# Patient Record
Sex: Male | Born: 1950
Health system: Southern US, Community
[De-identification: ages and names within clinical notes are randomized; demographics above are authoritative.]

## PROBLEM LIST (undated history)

## (undated) DIAGNOSIS — E785 Hyperlipidemia, unspecified: Secondary | ICD-10-CM

## (undated) DIAGNOSIS — E119 Type 2 diabetes mellitus without complications: Secondary | ICD-10-CM

## (undated) DIAGNOSIS — I10 Essential (primary) hypertension: Secondary | ICD-10-CM

## (undated) DIAGNOSIS — L0591 Pilonidal cyst without abscess: Secondary | ICD-10-CM

## (undated) DIAGNOSIS — N529 Male erectile dysfunction, unspecified: Secondary | ICD-10-CM

## (undated) DIAGNOSIS — N400 Enlarged prostate without lower urinary tract symptoms: Secondary | ICD-10-CM

## (undated) DIAGNOSIS — F32A Depression, unspecified: Secondary | ICD-10-CM

## (undated) DIAGNOSIS — F329 Major depressive disorder, single episode, unspecified: Secondary | ICD-10-CM

## (undated) HISTORY — DX: Type 2 diabetes mellitus without complications: E11.9

## (undated) HISTORY — PX: BACK SURGERY: SHX140

## (undated) HISTORY — DX: Pilonidal cyst without abscess: L05.91

## (undated) HISTORY — DX: Essential (primary) hypertension: I10

## (undated) HISTORY — DX: Hyperlipidemia, unspecified: E78.5

## (undated) HISTORY — DX: Major depressive disorder, single episode, unspecified: F32.9

## (undated) HISTORY — PX: OTHER SURGICAL HISTORY: SHX169

## (undated) HISTORY — DX: Depression, unspecified: F32.A

## (undated) HISTORY — DX: Male erectile dysfunction, unspecified: N52.9

## (undated) HISTORY — DX: Benign prostatic hyperplasia without lower urinary tract symptoms: N40.0

---

## 1998-07-09 ENCOUNTER — Emergency Department (HOSPITAL_COMMUNITY): Admission: EM | Admit: 1998-07-09 | Discharge: 1998-07-09 | Payer: Self-pay | Admitting: Emergency Medicine

## 2002-11-12 ENCOUNTER — Encounter: Payer: Self-pay | Admitting: Internal Medicine

## 2002-11-12 ENCOUNTER — Encounter: Admission: RE | Admit: 2002-11-12 | Discharge: 2002-11-12 | Payer: Self-pay | Admitting: Internal Medicine

## 2002-12-07 DIAGNOSIS — D126 Benign neoplasm of colon, unspecified: Secondary | ICD-10-CM | POA: Insufficient documentation

## 2006-06-03 ENCOUNTER — Encounter: Admission: RE | Admit: 2006-06-03 | Discharge: 2006-06-03 | Payer: Self-pay | Admitting: Internal Medicine

## 2006-06-11 ENCOUNTER — Ambulatory Visit: Payer: Self-pay | Admitting: Cardiology

## 2006-06-25 ENCOUNTER — Ambulatory Visit: Payer: Self-pay

## 2006-06-30 ENCOUNTER — Ambulatory Visit: Payer: Self-pay | Admitting: Cardiology

## 2007-09-30 DIAGNOSIS — E669 Obesity, unspecified: Secondary | ICD-10-CM | POA: Insufficient documentation

## 2007-09-30 DIAGNOSIS — I10 Essential (primary) hypertension: Secondary | ICD-10-CM | POA: Insufficient documentation

## 2007-09-30 DIAGNOSIS — M109 Gout, unspecified: Secondary | ICD-10-CM | POA: Insufficient documentation

## 2007-09-30 DIAGNOSIS — E119 Type 2 diabetes mellitus without complications: Secondary | ICD-10-CM

## 2008-07-01 ENCOUNTER — Ambulatory Visit: Payer: Self-pay | Admitting: Internal Medicine

## 2008-08-03 ENCOUNTER — Ambulatory Visit: Payer: Self-pay | Admitting: Internal Medicine

## 2008-08-12 ENCOUNTER — Encounter: Payer: Self-pay | Admitting: Internal Medicine

## 2008-08-12 ENCOUNTER — Ambulatory Visit: Payer: Self-pay | Admitting: Internal Medicine

## 2008-08-15 ENCOUNTER — Encounter: Payer: Self-pay | Admitting: Internal Medicine

## 2009-01-17 ENCOUNTER — Ambulatory Visit: Payer: Self-pay | Admitting: Internal Medicine

## 2009-02-27 ENCOUNTER — Ambulatory Visit: Payer: Self-pay | Admitting: Internal Medicine

## 2009-06-26 ENCOUNTER — Ambulatory Visit: Payer: Self-pay | Admitting: Internal Medicine

## 2009-10-12 ENCOUNTER — Ambulatory Visit: Payer: Self-pay | Admitting: Internal Medicine

## 2009-12-26 ENCOUNTER — Ambulatory Visit: Payer: Self-pay | Admitting: Internal Medicine

## 2010-06-28 ENCOUNTER — Ambulatory Visit: Payer: Self-pay | Admitting: Internal Medicine

## 2010-06-29 ENCOUNTER — Ambulatory Visit
Admission: RE | Admit: 2010-06-29 | Discharge: 2010-06-29 | Payer: Self-pay | Source: Home / Self Care | Admitting: Internal Medicine

## 2010-07-06 ENCOUNTER — Ambulatory Visit: Payer: Self-pay | Admitting: Internal Medicine

## 2010-09-13 ENCOUNTER — Ambulatory Visit (INDEPENDENT_AMBULATORY_CARE_PROVIDER_SITE_OTHER): Payer: BC Managed Care – PPO | Admitting: Internal Medicine

## 2010-09-13 DIAGNOSIS — F329 Major depressive disorder, single episode, unspecified: Secondary | ICD-10-CM

## 2010-10-01 ENCOUNTER — Encounter: Payer: Self-pay | Admitting: *Deleted

## 2010-10-29 LAB — GLUCOSE, CAPILLARY
Glucose-Capillary: 112 mg/dL — ABNORMAL HIGH (ref 70–99)
Glucose-Capillary: 125 mg/dL — ABNORMAL HIGH (ref 70–99)

## 2010-11-30 NOTE — Assessment & Plan Note (Signed)
[facility]                            CARDIOLOGY OFFICE NOTE   NAME:[name] [name], [name] [name]                      MRN:          [id number]  DATE:06/30/2006                            DOB:          03-Mar-1951    PRIMARY CARE PHYSICIAN:  Dr. Luanna Cole. [place].   REASON FOR VISIT:  Followup cardiac testing.   HISTORY OF PRESENT ILLNESS:  I saw Mr. Kornegay back in late November.  I  referred him for risk stratification and a history of obesity,  hypertension, type 2 diabetes mellitus, and hyperlipidemia.  He had had  some symptoms of dyspnea and chest discomfort, although with recent  bronchitis, and has noticed an improvement after antibiotics.  His  stress testing was reassured showing an ejection fraction of 61% with no  significant ST segment changes to suggest ischemia, and normal perfusion  images.  I reviewed this with him today and reassured him.  We talked  about basic risk factor modification, as well as trying to initiate  basic walking regimen with a goal of weight loss.   ALLERGIES:  No known drug allergies.   PRESENT MEDICATIONS:  Altace 5 mg p.o. daily, allopurinol 300 mg p.o.  daily, Lipitor 80 mg p.o. daily, Flomax 0.4 mg 2 tablets p.o. daily, and  glipizide XL 10 mg p.o. daily.   REVIEW OF SYSTEMS:  As described in the history of present illness.   PHYSICAL EXAMINATION:  Blood pressure 152/79, heart rate 78, weight 296  pounds.  Otherwise, no significant changes.   IMPRESSION/RECOMMENDATIONS:  Reassuring exercise Myoview showing a  normal left ventricular ejection fraction of 61% and no evidence of  ischemia.  Would continue a strategy of risk factor modification as  discussed above.  He will continue to followup with Dr. Lenord Fellers, and we  can see him back as needed.     Jonelle Sidle, MD  Electronically Signed    SGM/MedQ  DD: 06/30/2006  DT: 06/30/2006  Job #: [id number]   cc:   Luanna Cole. Lenord Fellers, M.D.

## 2010-11-30 NOTE — Assessment & Plan Note (Signed)
[facility]                            CARDIOLOGY OFFICE NOTE   NAME:[name] [name], [name] [name]                      MRN:          [id number]  DATE:06/11/2006                            DOB:          1951-06-18    REASON FOR CONSULTATION:  Chest pain and shortness of breath.   HISTORY OF PRESENT ILLNESS:  [name] [name] is a pleasant 60 year old [name]  with available history indicating hypertension, hyperlipidemia and type  2 diabetes mellitus.  He has no clearly documented history of coronary  artery disease, and tells me that he underwent screening stress testing  approximately 4 to 5 years ago that was reportedly normal.  He states  that over the last 8 to 10 months he has gained approximately 30 pounds,  and has not been exercising regularly.  He has been generally more  fatigued and has had more dyspnea with exertion, but within the last  month he has experienced some chest pressure.  He relates this to an  associated flu-like illness where he felt myalgias, more intense  fatigue and a tightness in his chest that was nonexertional.  He was  seen by Dr. Lenord Fellers, and I have records of a troponin I level that was  normal at 0.03 as well as a chest x-ray showing submaximal inspiration  with changes of slight chronic asthma or bronchitis.  He states that he  was treated with antibiotics and that his symptoms had improved  markedly.  His electrocardiogram from the 19th of November shows sinus  rhythm without significant ST segment change to suggest ischemia.  He  has not had any more recent ischemic evaluation.  Today we talked about  his risks for underlying coronary artery disease and the rationale  behind followup testing.   ALLERGIES:  No known drug allergies.   PRESENT MEDICATIONS:  1. Altace 5 mg p.o. daily.  2. Allopurinol 300 mg p.o. daily.  3. Lipitor 80 mg p.o. daily.  4. Flomax 0.4 mg two tablets p.o. daily.  5. Glipizide XL 10 mg p.o. daily.   PAST MEDICAL HISTORY:  Is as outlined in the history of present illness.  He states that he had a cyst removed from his spine in 1977.   SOCIAL HISTORY:  The patient is married.  He has two children.  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  He has prior history  of tobacco use, but quit in 1993.  He drinks up to 6 beers a day.   FAMILY HISTORY:  Significant for stomach cancer in the patient's mother,  who died at age 47, and lung cancer in the patient's father, who died at  age 65.  They both apparently died within 6 months of each other when he  was 61 to 60 years old.  He has three sisters.  He states that he was in  the [employer] prior to going to school.   REVIEW OF SYSTEMS:  As described in the history of present illness.  He  does have some erectile dysfunction.  He was recently diagnosed with  gout.  He has seasonal allergies.   PHYSICAL EXAMINATION:  Blood pressure is 130/82, heart rate is 72,  weight is 293 pounds.  This is an obese [name] in no acute distress.  HEENT:  Conjunctivae and lids are normal.  Oropharynx is clear.  NECK:  Supple without elevated jugular venous pressure or loud bruits,  no thyromegaly is noted.  LUNGS:  Clear without labored breathing.  No wheezing or rhonchi noted.  CARDIAC:  Exam reveals a regular rate and rhythm without loud murmur or  S3 gallop.  ABDOMEN:  Soft, nontender, no hepatomegaly, no bruits.  EXTREMITIES:  Exhibit no significant pitting edema. Skin is warm and  dry, no clubbing, cyanosis or edema.  Distal pulses are 2+.  MUSCULOSKELETAL:  No kyphosis is noted.  NEUROPSYCHIATRIC:  The patient is alert and oriented x3, affect is  normal.   IMPRESSION AND RECOMMENDATIONS:  1. [name] [name] with obesity, hypertension, type 2      diabetes mellitus and hyperlipidemia.  He has had some recent      symptoms of chest pressure, although seemingly in the setting of a      bronchitis.  He states that these symptoms  have improved      significantly following a course of antibiotics.  Having said that,      he has not had any followup ischemic testing over the last 4 to 5      years, and also reports some dyspnea on exertion, although he has      had a 30-pound weight gain over the last 10 months.  Today we      talked about risk factor modification and a followup exercise      Myoview.  I will have him return to the office to review the      results.  2. Further plans to follow.     Jonelle Sidle, MD  Electronically Signed    SGM/MedQ  DD: 06/11/2006  DT: 06/12/2006  Job #: [id number]   cc:   Luanna Cole. Lenord Fellers, M.D.

## 2010-12-27 ENCOUNTER — Other Ambulatory Visit: Payer: BC Managed Care – PPO | Admitting: Internal Medicine

## 2010-12-27 DIAGNOSIS — E119 Type 2 diabetes mellitus without complications: Secondary | ICD-10-CM

## 2010-12-27 DIAGNOSIS — E785 Hyperlipidemia, unspecified: Secondary | ICD-10-CM

## 2010-12-27 LAB — HEPATIC FUNCTION PANEL
ALT: 30 U/L (ref 0–53)
AST: 31 U/L (ref 0–37)
Albumin: 4.5 g/dL (ref 3.5–5.2)
Alkaline Phosphatase: 29 U/L — ABNORMAL LOW (ref 39–117)
Bilirubin, Direct: 0.2 mg/dL (ref 0.0–0.3)
Indirect Bilirubin: 0.6 mg/dL (ref 0.0–0.9)
Total Bilirubin: 0.8 mg/dL (ref 0.3–1.2)
Total Protein: 6.9 g/dL (ref 6.0–8.3)

## 2010-12-27 LAB — LIPID PANEL
Cholesterol: 136 mg/dL (ref 0–200)
HDL: 35 mg/dL — ABNORMAL LOW (ref >39)
LDL Cholesterol: 62 mg/dL (ref 0–99)
Total CHOL/HDL Ratio: 3.9 Ratio
Triglycerides: 195 mg/dL — ABNORMAL HIGH (ref <150)
VLDL: 39 mg/dL (ref 0–40)

## 2010-12-27 LAB — HEMOGLOBIN A1C
Hgb A1c MFr Bld: 6 % — ABNORMAL HIGH (ref <5.7)
Mean Plasma Glucose: 126 mg/dL — ABNORMAL HIGH (ref <117)

## 2010-12-28 ENCOUNTER — Ambulatory Visit (INDEPENDENT_AMBULATORY_CARE_PROVIDER_SITE_OTHER): Payer: BC Managed Care – PPO | Admitting: Internal Medicine

## 2010-12-28 ENCOUNTER — Encounter: Payer: Self-pay | Admitting: Internal Medicine

## 2010-12-28 DIAGNOSIS — M7918 Myalgia, other site: Secondary | ICD-10-CM | POA: Insufficient documentation

## 2010-12-28 DIAGNOSIS — E119 Type 2 diabetes mellitus without complications: Secondary | ICD-10-CM

## 2010-12-28 DIAGNOSIS — N529 Male erectile dysfunction, unspecified: Secondary | ICD-10-CM

## 2010-12-28 DIAGNOSIS — E785 Hyperlipidemia, unspecified: Secondary | ICD-10-CM

## 2010-12-28 DIAGNOSIS — I10 Essential (primary) hypertension: Secondary | ICD-10-CM

## 2010-12-28 DIAGNOSIS — IMO0001 Reserved for inherently not codable concepts without codable children: Secondary | ICD-10-CM

## 2010-12-28 NOTE — Progress Notes (Signed)
  Subjective:    Patient ID: [name] [name], male    DOB: 01/13/1951, 60 y.o.   MRN: [id number]  HPI patient here for six-month recheck on diabetes, hypertension, and hyperlipidemia. Hemoglobin A1c is markedly improved from December 2011. Triglycerides are improved as well. He is on both statin and TriCor for significant hyperlipidemia. Blood pressure stable on Ace inhibitor. Monitors Accu-Cheks infrequently. Says he had recent diabetic eye exam. No foot problems just some numbness in both great toes.    Review of Systems     Objective:   Physical Exam neck is supple without carotid bruits; chest clear to auscultation; cardiac exam regular rate and rhythm normal S1 and S2; extremities without edema; diabetic foot exam: Pulses 2+ and symmetrical in both feet no significant calluses. No fungal infection. Slight numbness to sensation testing both great toes.        Assessment & Plan:  Adult onset diabetes mellitus-stable on current regimen Hypertension stable on current regimen Hyperlipidemia stable on 2 drug regimen. Could benefit from diet exercise and weight loss Obesity encouraged diet exercise and weight loss Musculoskeletal pain particularly in shoulder at times History of gout Erectile dysfunction treated with Cialis  Plan return in 6 months for physical examination. Continue current regimen. For musculoskeletal pain prescription given for Vicodin 5/500 #61 by mouth every 6 hours as needed for pain with one refill. Samples of Cialis given.

## 2010-12-28 NOTE — Patient Instructions (Signed)
Diet exercise and lose weight. Annual diabetic eye exam.

## 2011-02-18 ENCOUNTER — Telehealth: Payer: Self-pay | Admitting: Internal Medicine

## 2011-02-18 MED ORDER — BUPROPION HCL ER (XL) 300 MG PO TB24: 300.0000 mg | ORAL_TABLET | Freq: Every day | ORAL | Status: DC

## 2011-02-18 NOTE — Telephone Encounter (Signed)
Please call in one year refill on Wellbutrin and tell pt not to worry. No harm from his mistake.

## 2011-07-02 ENCOUNTER — Other Ambulatory Visit: Payer: BC Managed Care – PPO | Admitting: Internal Medicine

## 2011-07-02 DIAGNOSIS — Z125 Encounter for screening for malignant neoplasm of prostate: Secondary | ICD-10-CM

## 2011-07-02 DIAGNOSIS — I10 Essential (primary) hypertension: Secondary | ICD-10-CM

## 2011-07-02 DIAGNOSIS — E785 Hyperlipidemia, unspecified: Secondary | ICD-10-CM

## 2011-07-02 LAB — CBC WITH DIFFERENTIAL/PLATELET
Basophils Absolute: 0.1 10*3/uL (ref 0.0–0.1)
Basophils Relative: 1 % (ref 0–1)
Eosinophils Absolute: 0.1 10*3/uL (ref 0.0–0.7)
Eosinophils Relative: 1 % (ref 0–5)
HCT: 45.2 % (ref 39.0–52.0)
Hemoglobin: 14.7 g/dL (ref 13.0–17.0)
Lymphocytes Relative: 34 % (ref 12–46)
Lymphs Abs: 2.1 10*3/uL (ref 0.7–4.0)
MCH: 30 pg (ref 26.0–34.0)
MCHC: 32.5 g/dL (ref 30.0–36.0)
MCV: 92.2 fL (ref 78.0–100.0)
Monocytes Absolute: 0.6 10*3/uL (ref 0.1–1.0)
Monocytes Relative: 9 % (ref 3–12)
Neutro Abs: 3.4 10*3/uL (ref 1.7–7.7)
Neutrophils Relative %: 55 % (ref 43–77)
Platelets: 161 10*3/uL (ref 150–400)
RBC: 4.9 MIL/uL (ref 4.22–5.81)
RDW: 13.4 % (ref 11.5–15.5)
WBC: 6.2 10*3/uL (ref 4.0–10.5)

## 2011-07-02 LAB — COMPREHENSIVE METABOLIC PANEL
ALT: 38 U/L (ref 0–53)
AST: 35 U/L (ref 0–37)
Albumin: 4.5 g/dL (ref 3.5–5.2)
Alkaline Phosphatase: 32 U/L — ABNORMAL LOW (ref 39–117)
BUN: 19 mg/dL (ref 6–23)
CO2: 26 mEq/L (ref 19–32)
Calcium: 9.9 mg/dL (ref 8.4–10.5)
Chloride: 102 mEq/L (ref 96–112)
Creat: 1.19 mg/dL (ref 0.50–1.35)
Glucose, Bld: 151 mg/dL — ABNORMAL HIGH (ref 70–99)
Potassium: 4.4 mEq/L (ref 3.5–5.3)
Sodium: 137 mEq/L (ref 135–145)
Total Bilirubin: 0.4 mg/dL (ref 0.3–1.2)
Total Protein: 6.8 g/dL (ref 6.0–8.3)

## 2011-07-02 LAB — LIPID PANEL
Cholesterol: 158 mg/dL (ref 0–200)
HDL: 33 mg/dL — ABNORMAL LOW (ref >39)
LDL Cholesterol: 58 mg/dL (ref 0–99)
Total CHOL/HDL Ratio: 4.8 Ratio
Triglycerides: 334 mg/dL — ABNORMAL HIGH (ref <150)
VLDL: 67 mg/dL — ABNORMAL HIGH (ref 0–40)

## 2011-07-02 LAB — PSA: PSA: 1.24 ng/mL (ref <=4.00)

## 2011-07-03 LAB — HEMOGLOBIN A1C
Hgb A1c MFr Bld: 7.2 % — ABNORMAL HIGH (ref <5.7)
Mean Plasma Glucose: 160 mg/dL — ABNORMAL HIGH (ref <117)

## 2011-07-04 ENCOUNTER — Ambulatory Visit (INDEPENDENT_AMBULATORY_CARE_PROVIDER_SITE_OTHER): Payer: BC Managed Care – PPO | Admitting: Internal Medicine

## 2011-07-04 ENCOUNTER — Encounter: Payer: Self-pay | Admitting: Internal Medicine

## 2011-07-04 VITALS — BP 122/78 | HR 76 | Temp 98.1°F | Ht 74.0 in | Wt 272.0 lb

## 2011-07-04 DIAGNOSIS — G609 Hereditary and idiopathic neuropathy, unspecified: Secondary | ICD-10-CM

## 2011-07-04 DIAGNOSIS — I10 Essential (primary) hypertension: Secondary | ICD-10-CM

## 2011-07-04 DIAGNOSIS — E119 Type 2 diabetes mellitus without complications: Secondary | ICD-10-CM

## 2011-07-04 DIAGNOSIS — F329 Major depressive disorder, single episode, unspecified: Secondary | ICD-10-CM

## 2011-07-04 DIAGNOSIS — F32A Depression, unspecified: Secondary | ICD-10-CM | POA: Insufficient documentation

## 2011-07-04 DIAGNOSIS — M25512 Pain in left shoulder: Secondary | ICD-10-CM

## 2011-07-04 DIAGNOSIS — N529 Male erectile dysfunction, unspecified: Secondary | ICD-10-CM

## 2011-07-04 DIAGNOSIS — M25519 Pain in unspecified shoulder: Secondary | ICD-10-CM

## 2011-07-04 DIAGNOSIS — Z23 Encounter for immunization: Secondary | ICD-10-CM

## 2011-07-04 DIAGNOSIS — E8881 Metabolic syndrome: Secondary | ICD-10-CM

## 2011-07-04 DIAGNOSIS — E781 Pure hyperglyceridemia: Secondary | ICD-10-CM

## 2011-07-04 DIAGNOSIS — E1142 Type 2 diabetes mellitus with diabetic polyneuropathy: Secondary | ICD-10-CM

## 2011-07-04 DIAGNOSIS — E1149 Type 2 diabetes mellitus with other diabetic neurological complication: Secondary | ICD-10-CM

## 2011-07-04 LAB — POCT URINALYSIS DIPSTICK
Bilirubin, UA: NEGATIVE
Blood, UA: NEGATIVE
Glucose, UA: NEGATIVE
Ketones, UA: NEGATIVE
Leukocytes, UA: NEGATIVE
Nitrite, UA: NEGATIVE
Protein, UA: NEGATIVE
Spec Grav, UA: 1.025
Urobilinogen, UA: NEGATIVE
pH, UA: 6

## 2011-07-04 NOTE — Progress Notes (Signed)
Addended by: Judy Pimple on: 07/04/2011 02:48 PM   Modules accepted: Orders

## 2011-07-04 NOTE — Patient Instructions (Signed)
Your diabetic control is not quite as good as it was 6 months ago neither is your triglyceride level. Please follow strict diet and try to exercise. We will recheck both of these in 6 months. Take Vicodin sparingly for left shoulder pain. We would be happy to refer you to hand surgeon for further evaluation of shoulder issues. Otherwise continue same medications. Return in 6 months

## 2011-07-04 NOTE — Progress Notes (Signed)
  Subjective:    Patient ID: [name] [name], male    DOB: 26-Sep-1950, 60 y.o.   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 In today for review of medical problems. Recent lab studies indicate triglycerides are slightly over 300 195 6 months ago. Hemoglobin A1c is 7.2 and previously was 6.06 months ago. Admits to not dieting and exercising as much as she should. Having issues with left shoulder pain. Painful to raise left arm up over his head. Offered referral to specialist but he declines at this point in time. Takes Vicodin sparingly for shoulder pain. No other complaints or problems except for numbness in both great toes. He did have some issues with BPH and took Flomax but no longer takes that.    Review of Systems  Constitutional: Negative.   HENT: Negative.   Eyes: Negative.   Respiratory: Negative.   Cardiovascular: Negative.   Gastrointestinal: Negative.   Genitourinary: Negative.        Erectile dysfunction  Musculoskeletal:       Pain when raising left arm up over his head  Neurological:       Numbness both great toes  Hematological: Negative.   Psychiatric/Behavioral: Negative.        Objective:   Physical Exam  Vitals reviewed. Constitutional: He is oriented to person, place, and time. He appears well-developed and well-nourished. No distress.  HENT:  Head: Normocephalic and atraumatic.  Right Ear: External ear normal.  Left Ear: External ear normal.  Mouth/Throat: Oropharynx is clear and moist. No oropharyngeal exudate.  Eyes: Conjunctivae and EOM are normal. Pupils are equal, round, and reactive to light. Right eye exhibits no discharge. Left eye exhibits no discharge. No scleral icterus.  Neck: Neck supple. No JVD present. No thyromegaly present.  Cardiovascular: Normal rate, regular rhythm, normal heart sounds and intact distal pulses.     Pulmonary/Chest: Effort normal and breath sounds normal. He has no wheezes. He has no rales. He exhibits no tenderness.  Abdominal: Soft. Bowel sounds are normal. He exhibits no distension and no mass. There is no tenderness. There is no rebound and no guarding.  Genitourinary: Rectum normal and prostate normal.  Musculoskeletal: He exhibits no edema.       Decreased range of motion left shoulder. Diabetic foot exam reveals pulses to be normal. No ulcers on feet.  Lymphadenopathy:    He has no cervical adenopathy.  Neurological: He is alert and oriented to person, place, and time. He has normal reflexes. No cranial nerve deficit.  Skin: Skin is warm and dry. No rash noted.  Psychiatric: He has a normal mood and affect. His behavior is normal. Judgment and thought content normal.          Assessment & Plan:  Hyperlipidemia  Diabetes mellitus  Obesity  Probable left shoulder impingement as a cause of left shoulder pain versus frozen left shoulder  Hypertension  Metabolic syndrome  Depression  Erectile dysfunction  Mild peripheral neuropathy both great toes likely secondary to diabetes

## 2011-07-05 LAB — MICROALBUMIN, URINE: Microalb, Ur: 0.52 mg/dL (ref 0.00–1.89)

## 2011-07-22 ENCOUNTER — Other Ambulatory Visit: Payer: Self-pay | Admitting: Internal Medicine

## 2011-07-23 ENCOUNTER — Telehealth: Payer: Self-pay | Admitting: Internal Medicine

## 2011-07-23 ENCOUNTER — Other Ambulatory Visit: Payer: Self-pay

## 2011-07-23 MED ORDER — ALLOPURINOL 300 MG PO TABS: 300.0000 mg | ORAL_TABLET | Freq: Every day | ORAL | Status: DC

## 2011-07-23 NOTE — Telephone Encounter (Signed)
Pt advised not to take Celexa per Dr. Lenord Fellers

## 2011-07-23 NOTE — Telephone Encounter (Signed)
Patient informed. 

## 2011-09-26 ENCOUNTER — Other Ambulatory Visit: Payer: Self-pay

## 2011-09-26 MED ORDER — BUPROPION HCL ER (XL) 300 MG PO TB24: 300.0000 mg | ORAL_TABLET | Freq: Every day | ORAL | Status: DC

## 2011-09-26 MED ORDER — ATORVASTATIN CALCIUM 80 MG PO TABS: 80.0000 mg | ORAL_TABLET | Freq: Every day | ORAL | Status: DC

## 2011-09-26 MED ORDER — GLIPIZIDE ER 10 MG PO TB24: 10.0000 mg | ORAL_TABLET | ORAL | Status: DC

## 2011-09-26 MED ORDER — ALLOPURINOL 300 MG PO TABS: 300.0000 mg | ORAL_TABLET | Freq: Every day | ORAL | Status: DC

## 2011-09-26 MED ORDER — FENOFIBRATE 145 MG PO TABS: 145.0000 mg | ORAL_TABLET | Freq: Every day | ORAL | Status: DC

## 2011-09-26 MED ORDER — RAMIPRIL 5 MG PO CAPS: 5.0000 mg | ORAL_CAPSULE | Freq: Every day | ORAL | Status: DC

## 2011-12-31 ENCOUNTER — Other Ambulatory Visit: Payer: BC Managed Care – PPO | Admitting: Internal Medicine

## 2011-12-31 DIAGNOSIS — E785 Hyperlipidemia, unspecified: Secondary | ICD-10-CM

## 2011-12-31 DIAGNOSIS — E119 Type 2 diabetes mellitus without complications: Secondary | ICD-10-CM

## 2011-12-31 DIAGNOSIS — Z79899 Other long term (current) drug therapy: Secondary | ICD-10-CM

## 2011-12-31 LAB — HEPATIC FUNCTION PANEL
ALT: 32 U/L (ref 0–53)
AST: 31 U/L (ref 0–37)
Albumin: 4.5 g/dL (ref 3.5–5.2)
Alkaline Phosphatase: 28 U/L — ABNORMAL LOW (ref 39–117)
Bilirubin, Direct: 0.2 mg/dL (ref 0.0–0.3)
Indirect Bilirubin: 0.5 mg/dL (ref 0.0–0.9)
Total Bilirubin: 0.7 mg/dL (ref 0.3–1.2)
Total Protein: 6.3 g/dL (ref 6.0–8.3)

## 2011-12-31 LAB — LIPID PANEL
Cholesterol: 130 mg/dL (ref 0–200)
HDL: 32 mg/dL — ABNORMAL LOW (ref >39)
LDL Cholesterol: 71 mg/dL (ref 0–99)
Total CHOL/HDL Ratio: 4.1 Ratio
Triglycerides: 134 mg/dL (ref <150)
VLDL: 27 mg/dL (ref 0–40)

## 2011-12-31 LAB — HEMOGLOBIN A1C
Hgb A1c MFr Bld: 7.4 % — ABNORMAL HIGH (ref <5.7)
Mean Plasma Glucose: 166 mg/dL — ABNORMAL HIGH (ref <117)

## 2012-01-01 ENCOUNTER — Other Ambulatory Visit: Payer: Self-pay

## 2012-01-06 ENCOUNTER — Ambulatory Visit (INDEPENDENT_AMBULATORY_CARE_PROVIDER_SITE_OTHER): Payer: BC Managed Care – PPO | Admitting: Internal Medicine

## 2012-01-06 ENCOUNTER — Encounter: Payer: Self-pay | Admitting: Internal Medicine

## 2012-01-06 VITALS — BP 110/74 | HR 76 | Temp 97.5°F | Ht 74.0 in | Wt 272.0 lb

## 2012-01-06 DIAGNOSIS — E8881 Metabolic syndrome: Secondary | ICD-10-CM

## 2012-01-12 ENCOUNTER — Encounter: Payer: Self-pay | Admitting: Internal Medicine

## 2012-01-12 DIAGNOSIS — E8881 Metabolic syndrome: Secondary | ICD-10-CM | POA: Insufficient documentation

## 2012-01-12 NOTE — Progress Notes (Signed)
  Subjective:    Patient ID: [name] [name], male    DOB: 11-01-1950, 61 y.o.   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 For the past 6 months he is really not had much to do. Considers himself now semiretired. Seems depressed about it. 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 Says he had a major project all through a few months ago which would've kept him busy.  Patient is on [place] real, baby aspirin, Lipitor, Glucotrol, Flomax for BPH, Wellbutrin for depression. Had colonoscopy 2004. Had Cardiolite study 2004. Had tetanus immunization 2004 and Pneumovax immunization December 2010. Had influenza immunization December 2012. Has eye physician in [facility]. Reminded about yearly eye exam.  Has noticed some numbness in his feet from time to time. This is a new complaint.    Review of Systems     Objective:   Physical Exam HEENT exam: TMs and pharynx are clear; neck: supple without thyromegaly or carotid bruits. Chest: clear to auscultation; cardiac exam: regular rate and rhythm normal S1 and S2; extremities without edema; pulses are normal in feet. No ulcers on feet. Sensation intact in lower extremities. No fungal infection on feet. Alert and oriented and affect is appropriate.        Assessment & Plan:  Diabetes mellitus  Hyperlipidemia  Hypertension  Metabolic syndrome  Obesity  New complaint of diabetic neuropathy lower extremities  History of depression  BPH  History of erectile dysfunction  Musculoskeletal pain  Plan: Vicodin 5/500 (#60) 1 by mouth every 6 hours when necessary for musculoskeletal pain. Encouraged diet exercise and weight loss. Continue same medications. Return in 6 months for physical exam. Lab  work reviewed. Hemoglobin A1c 7.4%. Lipid panel within normal limits. Has low HDL cholesterol 32. Liver functions are normal. Patient had microalbumin in urine checked December 2012 which was normal.

## 2012-01-12 NOTE — Patient Instructions (Addendum)
Please try to diet ,exercise and lose some weight. Continue same medications. Return in 6 months for physical examination.

## 2012-07-13 ENCOUNTER — Other Ambulatory Visit: Payer: BC Managed Care – PPO | Admitting: Internal Medicine

## 2012-07-13 DIAGNOSIS — I10 Essential (primary) hypertension: Secondary | ICD-10-CM

## 2012-07-13 DIAGNOSIS — E785 Hyperlipidemia, unspecified: Secondary | ICD-10-CM

## 2012-07-13 DIAGNOSIS — Z125 Encounter for screening for malignant neoplasm of prostate: Secondary | ICD-10-CM

## 2012-07-13 DIAGNOSIS — Z79899 Other long term (current) drug therapy: Secondary | ICD-10-CM

## 2012-07-13 DIAGNOSIS — E119 Type 2 diabetes mellitus without complications: Secondary | ICD-10-CM

## 2012-07-13 LAB — COMPREHENSIVE METABOLIC PANEL
ALT: 33 U/L (ref 0–53)
AST: 23 U/L (ref 0–37)
Albumin: 4.7 g/dL (ref 3.5–5.2)
Alkaline Phosphatase: 36 U/L — ABNORMAL LOW (ref 39–117)
BUN: 18 mg/dL (ref 6–23)
CO2: 26 mEq/L (ref 19–32)
Calcium: 9.8 mg/dL (ref 8.4–10.5)
Chloride: 102 mEq/L (ref 96–112)
Creat: 1.03 mg/dL (ref 0.50–1.35)
Glucose, Bld: 217 mg/dL — ABNORMAL HIGH (ref 70–99)
Potassium: 4.4 mEq/L (ref 3.5–5.3)
Sodium: 136 mEq/L (ref 135–145)
Total Bilirubin: 0.9 mg/dL (ref 0.3–1.2)
Total Protein: 6.8 g/dL (ref 6.0–8.3)

## 2012-07-13 LAB — HEMOGLOBIN A1C
Hgb A1c MFr Bld: 8.1 % — ABNORMAL HIGH (ref <5.7)
Mean Plasma Glucose: 186 mg/dL — ABNORMAL HIGH (ref <117)

## 2012-07-13 LAB — LIPID PANEL
Cholesterol: 154 mg/dL (ref 0–200)
HDL: 26 mg/dL — ABNORMAL LOW (ref >39)
LDL Cholesterol: 56 mg/dL (ref 0–99)
Total CHOL/HDL Ratio: 5.9 Ratio
Triglycerides: 359 mg/dL — ABNORMAL HIGH (ref <150)
VLDL: 72 mg/dL — ABNORMAL HIGH (ref 0–40)

## 2012-07-13 LAB — CBC WITH DIFFERENTIAL/PLATELET
Basophils Absolute: 0.1 10*3/uL (ref 0.0–0.1)
Basophils Relative: 1 % (ref 0–1)
Eosinophils Absolute: 0.1 10*3/uL (ref 0.0–0.7)
Eosinophils Relative: 2 % (ref 0–5)
HCT: 43.7 % (ref 39.0–52.0)
Hemoglobin: 15.3 g/dL (ref 13.0–17.0)
Lymphocytes Relative: 33 % (ref 12–46)
Lymphs Abs: 2.1 10*3/uL (ref 0.7–4.0)
MCH: 30.8 pg (ref 26.0–34.0)
MCHC: 35 g/dL (ref 30.0–36.0)
MCV: 88.1 fL (ref 78.0–100.0)
Monocytes Absolute: 0.5 10*3/uL (ref 0.1–1.0)
Monocytes Relative: 9 % (ref 3–12)
Neutro Abs: 3.4 10*3/uL (ref 1.7–7.7)
Neutrophils Relative %: 55 % (ref 43–77)
Platelets: 176 10*3/uL (ref 150–400)
RBC: 4.96 MIL/uL (ref 4.22–5.81)
RDW: 13.4 % (ref 11.5–15.5)
WBC: 6.2 10*3/uL (ref 4.0–10.5)

## 2012-07-13 LAB — PSA: PSA: 1.75 ng/mL (ref <=4.00)

## 2012-07-14 ENCOUNTER — Encounter: Payer: Self-pay | Admitting: Internal Medicine

## 2012-07-14 ENCOUNTER — Ambulatory Visit (INDEPENDENT_AMBULATORY_CARE_PROVIDER_SITE_OTHER): Payer: BC Managed Care – PPO | Admitting: Internal Medicine

## 2012-07-14 VITALS — BP 120/80 | HR 80 | Temp 97.4°F | Ht 73.0 in | Wt 272.0 lb

## 2012-07-14 DIAGNOSIS — N400 Enlarged prostate without lower urinary tract symptoms: Secondary | ICD-10-CM

## 2012-07-14 DIAGNOSIS — E1142 Type 2 diabetes mellitus with diabetic polyneuropathy: Secondary | ICD-10-CM

## 2012-07-14 DIAGNOSIS — E119 Type 2 diabetes mellitus without complications: Secondary | ICD-10-CM

## 2012-07-14 DIAGNOSIS — Z8659 Personal history of other mental and behavioral disorders: Secondary | ICD-10-CM

## 2012-07-14 DIAGNOSIS — M7918 Myalgia, other site: Secondary | ICD-10-CM

## 2012-07-14 DIAGNOSIS — IMO0001 Reserved for inherently not codable concepts without codable children: Secondary | ICD-10-CM

## 2012-07-14 DIAGNOSIS — I10 Essential (primary) hypertension: Secondary | ICD-10-CM

## 2012-07-14 DIAGNOSIS — Z862 Personal history of diseases of the blood and blood-forming organs and certain disorders involving the immune mechanism: Secondary | ICD-10-CM

## 2012-07-14 DIAGNOSIS — E1149 Type 2 diabetes mellitus with other diabetic neurological complication: Secondary | ICD-10-CM

## 2012-07-14 DIAGNOSIS — E785 Hyperlipidemia, unspecified: Secondary | ICD-10-CM

## 2012-07-14 DIAGNOSIS — Z8739 Personal history of other diseases of the musculoskeletal system and connective tissue: Secondary | ICD-10-CM

## 2012-07-14 DIAGNOSIS — N529 Male erectile dysfunction, unspecified: Secondary | ICD-10-CM

## 2012-07-14 LAB — POCT URINALYSIS DIPSTICK
Bilirubin, UA: NEGATIVE
Blood, UA: NEGATIVE
Ketones, UA: NEGATIVE
Leukocytes, UA: NEGATIVE
Nitrite, UA: NEGATIVE
Protein, UA: NEGATIVE
Spec Grav, UA: 1.01
Urobilinogen, UA: NEGATIVE
pH, UA: 6.5

## 2012-07-14 NOTE — Progress Notes (Signed)
Subjective:    Patient ID: [name] [name], male    DOB: [birth year], 61 y.o.   MRN: [id number]  HPI  61 year old White male for health maintenance and evaluation of medical problems. History of HTN, hyperlipdemia, and DM. 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 History of obesity, depression, erectile dysfunction, gout. Has musculoskeletal pain and takes hydrocodone/APAP from time to time for that sparingly. History of BPH and takes Flomax.  Had colonoscopy in 2004. Had Cardiolite study in 2004. Tetanus immunization 2004. Pneumovax immunization December 2010. He has eye physician in [facility]. Reminded about yearly eye exam.  Has some numbness in feet from time to time but no difficulty ambulating.  Social history: He is married completed one year of college. Enjoys golf. Does not smoke. Drinks beer. 2 adult children a son and a daughter.  Family history: Mother died of stomach cancer at age 65. Father died of lung cancer at age 82. They apparently died within 6 months of each other when he was 5 or 61 years old. He has 3 sisters in good health.  Patient says he had a cyst removed from his spine in 1977 but this may have been a pilonidal cyst. Saw Dr. Teressa Senter in 2007 and was felt to have degenerative arthritis of the a.c. joint right shoulder. Also had some narrowing of the subacromial output. In March 1999 he had a partial tear of his gastroc muscle left leg treated by Dr. due to. He is a former patient of Dr. Karma Ganja and has been a patient of this practice since 2004.  History of ruptured lumbar disc in the remote past.    Review of Systems  Constitutional: Positive for fatigue.  HENT: Negative.   Eyes: Negative.   Respiratory: Negative.   Cardiovascular: Negative.   Gastrointestinal: Negative.   Genitourinary:       BPH symptoms  Musculoskeletal: Positive for back pain.  Neurological: Negative.   Psychiatric/Behavioral: Positive  for dysphoric mood.       Objective:   Physical Exam  Vitals reviewed. Constitutional: He is oriented to person, place, and time. He appears well-developed and well-nourished. No distress.  HENT:  Head: Normocephalic and atraumatic.  Right Ear: External ear normal.  Left Ear: External ear normal.  Mouth/Throat: Oropharynx is clear and moist. No oropharyngeal exudate.  Eyes: Conjunctivae normal and EOM are normal. Pupils are equal, round, and reactive to light. Right eye exhibits no discharge. Left eye exhibits no discharge.  Neck: Neck supple. No JVD present. No thyromegaly present.  Cardiovascular: Normal rate, regular rhythm, normal heart sounds and intact distal pulses.   No murmur heard. Pulmonary/Chest: Effort normal and breath sounds normal. No respiratory distress. He has no wheezes. He has no rales. He exhibits no tenderness.  Abdominal: Soft. Bowel sounds are normal. He exhibits no distension and no mass. There is no tenderness. There is no rebound and no guarding.  Genitourinary: Prostate normal.  Musculoskeletal: He exhibits no edema.  Lymphadenopathy:    He has no cervical adenopathy.  Neurological: He is alert and oriented to person, place, and time. He has normal reflexes. No cranial nerve deficit. Coordination normal.       Mild peripheral neuropathy in stocking glove distribution. However sensation is intact on sensory testing  Skin: Skin is warm and dry. No rash noted. He is not diaphoretic.  Psychiatric: He has a normal mood and affect. His behavior is  normal. Judgment and thought content normal.          Assessment & Plan:  Hyperlipidemia  Hypertension  Adult onset diabetes mellitus  Peripheral neuropathy secondary to diabetes  BPH  History of depression  Erectile dysfunction  History of gout  Plan: Continue same medications and return in 6 months. He has been eating far too much over the past few weeks with the holidays and needs to get back on  strict diet and try to get more exercise. Triglycerides are elevated compared to 6 months ago. He is on Glucotrol for diabetes. Reminded about yearly eye exam. We are out of influenza vaccine and he should get one at [facility]. Return in 6 months.

## 2012-07-14 NOTE — Patient Instructions (Addendum)
Watch diet and exercise. Triglycerides are elevated due to eating sausage. Return in 6 months for office visit, hemoglobin A1c lipid panel liver functions. Continue same medications.

## 2012-07-15 LAB — MICROALBUMIN, URINE: Microalb, Ur: 0.5 mg/dL (ref 0.00–1.89)

## 2012-08-26 ENCOUNTER — Other Ambulatory Visit: Payer: Self-pay | Admitting: Internal Medicine

## 2012-08-27 NOTE — Telephone Encounter (Signed)
Changing to Hydrocodone APAP 5/325

## 2012-08-27 NOTE — Telephone Encounter (Signed)
Changing to Hydrocodone APAP 5/325 #60 with no refill called to [facility] on 08/27/12

## 2012-08-31 ENCOUNTER — Other Ambulatory Visit: Payer: Self-pay

## 2012-08-31 MED ORDER — HYDROCODONE-ACETAMINOPHEN 5-325 MG PO TABS: 1.0000 | ORAL_TABLET | Freq: Four times a day (QID) | ORAL | Status: DC | PRN

## 2012-08-31 NOTE — Telephone Encounter (Signed)
Rx changed as directed

## 2012-09-07 ENCOUNTER — Encounter: Payer: Self-pay | Admitting: Internal Medicine

## 2012-09-07 ENCOUNTER — Ambulatory Visit (INDEPENDENT_AMBULATORY_CARE_PROVIDER_SITE_OTHER): Payer: Managed Care, Other (non HMO) | Admitting: Internal Medicine

## 2012-09-07 VITALS — BP 168/84 | HR 84 | Temp 98.6°F | Wt 274.0 lb

## 2012-09-07 DIAGNOSIS — M545 Low back pain, unspecified: Secondary | ICD-10-CM

## 2012-09-07 DIAGNOSIS — N400 Enlarged prostate without lower urinary tract symptoms: Secondary | ICD-10-CM

## 2012-09-07 DIAGNOSIS — IMO0001 Reserved for inherently not codable concepts without codable children: Secondary | ICD-10-CM

## 2012-09-07 DIAGNOSIS — M5416 Radiculopathy, lumbar region: Secondary | ICD-10-CM

## 2012-09-07 DIAGNOSIS — IMO0002 Reserved for concepts with insufficient information to code with codable children: Secondary | ICD-10-CM

## 2012-09-07 DIAGNOSIS — R351 Nocturia: Secondary | ICD-10-CM

## 2012-09-07 DIAGNOSIS — N419 Inflammatory disease of prostate, unspecified: Secondary | ICD-10-CM

## 2012-09-07 LAB — POCT URINALYSIS DIPSTICK
Bilirubin, UA: NEGATIVE
Blood, UA: NEGATIVE
Glucose, UA: NEGATIVE
Ketones, UA: NEGATIVE
Leukocytes, UA: NEGATIVE
Nitrite, UA: NEGATIVE
Protein, UA: NEGATIVE
Spec Grav, UA: 1.02
Urobilinogen, UA: NEGATIVE
pH, UA: 6

## 2012-09-07 LAB — GLUCOSE, POCT (MANUAL RESULT ENTRY)

## 2012-09-07 NOTE — Progress Notes (Signed)
  Subjective:    Patient ID: [name] [name], male    DOB: June 23, 1951, 62 y.o.   MRN: [id number]  HPI 62 year old white male in today with acute back pain. Week before last he slid on some ice but did not fall. He actually lurched backwards and strained his back. It has not been getting better despite taking hydrocodone/APAP several times a day. Has numbness right anterior thigh. Old records indicate patient had episode of acute back pain September 1998 with left lower extremity radiculopathy. Had numbness in the lateral aspect of left leg. Had muscle weakness left lower extremity. According to Dr. Audrie Lia records, MRI showed herniated disc L4-L5 and L5-S1. He had 3 epidural steroid injections with improvement. These were done Radiology. He was evaluatedby Dr. Lajoyce Corners.in 1998. This seemed to start after playing golf and he had no further recurrence until male.  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 He can hardly get in and out of the truck to make the trip. He is in a lot of pain. He standing up today in the office and doesn't want to sit down. His blood pressure is elevated I believe secondary to pain. Says his Accu-Cheks have been elevated as well running around 180. He's also had multiple episodes of nocturia. He has seen urologist in the past and was placed on twice daily Flomax for BPH. Says urine stream is decreased. No trouble with incontinence of bowel or bladder or urinary retention.    Review of Systems     Objective:   Physical Exam straight leg raising is negative at 90 on the right. Muscle strength appears to be normal in the right lower extremity as are deep tendon reflexes. Has pain in the right paralumbar area. Straight leg raising is negative on the left at 90. Prostate exam reveals the prostate to be boggy. Urinalysis shows no evidence of infection. Accu-Chek is 147.        Assessment & Plan:  Type 2 diabetes  mellitus-not well controlled  Acute low back pain with right lower extremity radiculopathy distribution is L2-L3  Hypertension-aggravated by pain  History of herniated disc on left L4-L5 L5-S1  BPH  Nocturia  Prostatitis  Plan: Tylox #60 one by mouth twice daily for pain. Sterapred DS 10 mg 12 day dosepak take as corrected. This is likely to aggravate hyperglycemia. Sample of Lantus Solo Star pen to use 7 units at bedtime Cipro 500 mg twice daily for 2 weeks for prostatitis

## 2012-09-07 NOTE — Addendum Note (Signed)
Addended by: Judy Pimple on: 09/07/2012 04:59 PM   Modules accepted: Orders

## 2012-09-07 NOTE — Patient Instructions (Addendum)
Take Tylox as needed for pain twice daily. Take Sterapred DS 10 mg 12 day dosepak. Return in 2 weeks. Take Lantus insulin 7 units at bedtime. Take Cipro 500 mg twice daily for 2 weeks for prostatitis.

## 2012-09-14 ENCOUNTER — Telehealth: Payer: Self-pay

## 2012-09-14 ENCOUNTER — Telehealth: Payer: Self-pay | Admitting: Internal Medicine

## 2012-09-14 DIAGNOSIS — M5410 Radiculopathy, site unspecified: Secondary | ICD-10-CM

## 2012-09-14 DIAGNOSIS — M519 Unspecified thoracic, thoracolumbar and lumbosacral intervertebral disc disorder: Secondary | ICD-10-CM

## 2012-09-14 MED ORDER — OXYCODONE-ACETAMINOPHEN 5-500 MG PO CAPS: 1.0000 | ORAL_CAPSULE | ORAL | Status: DC | PRN

## 2012-09-14 NOTE — Telephone Encounter (Signed)
Patient scheduled for an MRI of L Spine on 09/22/2012 at 9:00 pm at [facility], [address]. Advised of this appointment. Also requesting something stronger for pain control.

## 2012-09-14 NOTE — Telephone Encounter (Signed)
Pt needs MRI of LS spine. Please arrange.

## 2012-09-15 ENCOUNTER — Other Ambulatory Visit: Payer: Self-pay

## 2012-09-15 MED ORDER — OXYCODONE-ACETAMINOPHEN 5-325 MG PO TABS: 1.0000 | ORAL_TABLET | Freq: Three times a day (TID) | ORAL | Status: DC | PRN

## 2012-09-15 NOTE — Telephone Encounter (Signed)
Tylox 5/500mg  is not available at the pharmacies any longer. Per Dr. Lenord Fellers will substitute Percocet 5/325mg  instead. Patient aware, and wife to pick up rx.

## 2012-09-16 ENCOUNTER — Ambulatory Visit
Admission: RE | Admit: 2012-09-16 | Discharge: 2012-09-16 | Disposition: A | Discharge status: Home / Self Care | Payer: Managed Care, Other (non HMO) | Source: Ambulatory Visit | Attending: Internal Medicine | Admitting: Internal Medicine

## 2012-09-17 ENCOUNTER — Other Ambulatory Visit: Payer: Self-pay | Admitting: Internal Medicine

## 2012-09-20 ENCOUNTER — Other Ambulatory Visit: Payer: Self-pay | Admitting: Internal Medicine

## 2012-09-21 NOTE — Telephone Encounter (Signed)
Patient informed, referral done. Has appointment with Dr. Lovell Sheehan on 09/22/2012

## 2012-09-22 ENCOUNTER — Other Ambulatory Visit: Payer: Managed Care, Other (non HMO)

## 2012-12-21 ENCOUNTER — Other Ambulatory Visit: Payer: Self-pay | Admitting: Internal Medicine

## 2013-01-11 ENCOUNTER — Other Ambulatory Visit: Payer: BC Managed Care – PPO | Admitting: Internal Medicine

## 2013-01-11 DIAGNOSIS — E785 Hyperlipidemia, unspecified: Secondary | ICD-10-CM

## 2013-01-11 DIAGNOSIS — E119 Type 2 diabetes mellitus without complications: Secondary | ICD-10-CM

## 2013-01-11 DIAGNOSIS — Z79899 Other long term (current) drug therapy: Secondary | ICD-10-CM

## 2013-01-11 LAB — HEPATIC FUNCTION PANEL
ALT: 28 U/L (ref 0–53)
AST: 24 U/L (ref 0–37)
Albumin: 4.2 g/dL (ref 3.5–5.2)
Alkaline Phosphatase: 32 U/L — ABNORMAL LOW (ref 39–117)
Bilirubin, Direct: 0.2 mg/dL (ref 0.0–0.3)
Indirect Bilirubin: 0.5 mg/dL (ref 0.0–0.9)
Total Bilirubin: 0.7 mg/dL (ref 0.3–1.2)
Total Protein: 6.2 g/dL (ref 6.0–8.3)

## 2013-01-11 LAB — LIPID PANEL
Cholesterol: 142 mg/dL (ref 0–200)
HDL: 30 mg/dL — ABNORMAL LOW (ref >39)
LDL Cholesterol: 69 mg/dL (ref 0–99)
Total CHOL/HDL Ratio: 4.7 Ratio
Triglycerides: 216 mg/dL — ABNORMAL HIGH (ref <150)
VLDL: 43 mg/dL — ABNORMAL HIGH (ref 0–40)

## 2013-01-11 LAB — HEMOGLOBIN A1C
Hgb A1c MFr Bld: 7.6 % — ABNORMAL HIGH (ref <5.7)
Mean Plasma Glucose: 171 mg/dL — ABNORMAL HIGH (ref <117)

## 2013-01-12 ENCOUNTER — Encounter: Payer: Self-pay | Admitting: Internal Medicine

## 2013-01-12 ENCOUNTER — Ambulatory Visit (INDEPENDENT_AMBULATORY_CARE_PROVIDER_SITE_OTHER): Payer: BC Managed Care – PPO | Admitting: Internal Medicine

## 2013-01-12 VITALS — BP 144/90 | HR 72 | Temp 97.7°F | Wt 266.0 lb

## 2013-01-12 DIAGNOSIS — E785 Hyperlipidemia, unspecified: Secondary | ICD-10-CM

## 2013-01-12 DIAGNOSIS — H6121 Impacted cerumen, right ear: Secondary | ICD-10-CM

## 2013-01-12 DIAGNOSIS — M549 Dorsalgia, unspecified: Secondary | ICD-10-CM

## 2013-01-12 DIAGNOSIS — I10 Essential (primary) hypertension: Secondary | ICD-10-CM

## 2013-01-12 DIAGNOSIS — H6120 Impacted cerumen, unspecified ear: Secondary | ICD-10-CM

## 2013-01-12 DIAGNOSIS — E119 Type 2 diabetes mellitus without complications: Secondary | ICD-10-CM

## 2013-01-12 DIAGNOSIS — N529 Male erectile dysfunction, unspecified: Secondary | ICD-10-CM

## 2013-01-12 DIAGNOSIS — E8881 Metabolic syndrome: Secondary | ICD-10-CM

## 2013-01-12 MED ORDER — SITAGLIPTIN PHOSPHATE 100 MG PO TABS: 100.0000 mg | ORAL_TABLET | Freq: Every day | ORAL | Status: DC

## 2013-01-12 MED ORDER — HYDROCODONE-ACETAMINOPHEN 10-325 MG PO TABS: ORAL_TABLET | ORAL | Status: DC

## 2013-01-12 NOTE — Patient Instructions (Addendum)
Start Januvia 100 mg daily. Discontinue glipizide. Increase Ramipril 10 mg daily. Take Norco 10/325 sparingly for back pain. Return in 5 weeks

## 2013-01-12 NOTE — Progress Notes (Signed)
  Subjective:    Patient ID: [name] [name], male    DOB: October 14, 1950, 62 y.o.   MRN: [id number]  HPI  62 year old white male who earlier this year developed back pain and right leg radiculopathy. Subsequently was found to have a right L2 -L3 disc. He underwent right L2-L3 lateral vasectomy using micro-dissection on March 17 by Dr. Lovell Sheehan. He has done well since that time, experienced immediate pain relief,. His platelet off 3 times since surgery. Still has some stiffness in. Would like to have narcotic pain medication on hand from time to time. He is a Chiropractor. 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  He has a history of diabetes mellitus metabolic syndrome, hyperlipidemia, hypertension.  I would like to see his blood pressure under better control. I would like to see  his glucose under better control.  Therefore it makes him changes in his medication regimen. Admits to drinking beer. Probably this affects his triglycerides, his weight and blood pressure control. He needs to cut back on beer consumption. He needs to get more exercise.  Had diabetic eye exam may 2013 by optometrist. Reminded to get annual eye exam.    Review of Systems     Objective:   Physical Exam  skin is warm and dry. Neck supple without JVD thyromegaly or carotid bruits; chest clear to auscultation without rales or wheezing; cardiac exam regular rate and rhythm normal S1 and S2 without murmurs or gallops; extremities without edema; diabetic foot exam no evidence of neuropathy, ulcers or calluses        Assessment & Plan:   Hypertension-could be under better control. Increase Ramipril to 10 mg daily. Return in 5 weeks  Type 2 diabetes mellitus-discontinue glipizide. Continue Lantus at bedtime. Start Januvia 100 mg daily  Erectile dysfunction-samples of Viagra given  Metabolic syndrome-needs to decrease beer consumption  Obesity-needs to diet and exercise  Impacted  cerumen right ear-removed with curette  Back pain status post L2-L3 discectomy March 2014. New prescription for Norco 10/325 #60 one by mouth twice daily with 3 refills to take sparingly for back pain.  Return in 5 weeks at which time we'll need b- met, hemoglobin A1c, urine for microalbuminuria  Needs tetanus immunization upon return

## 2013-01-13 ENCOUNTER — Other Ambulatory Visit: Payer: Self-pay

## 2013-01-25 ENCOUNTER — Other Ambulatory Visit: Payer: Self-pay

## 2013-01-25 ENCOUNTER — Telehealth: Payer: Self-pay | Admitting: Internal Medicine

## 2013-01-25 MED ORDER — CYCLOBENZAPRINE HCL 10 MG PO TABS: 10.0000 mg | ORAL_TABLET | Freq: Every evening | ORAL | Status: DC | PRN

## 2013-01-25 NOTE — Telephone Encounter (Signed)
[facility] ENT for ear problem Flexeril 10 mg hs #30 but needs to call neurosurgeon back if back is bothering him again

## 2013-01-26 ENCOUNTER — Telehealth: Payer: Self-pay

## 2013-01-26 DIAGNOSIS — H9190 Unspecified hearing loss, unspecified ear: Secondary | ICD-10-CM

## 2013-01-26 NOTE — Telephone Encounter (Signed)
Patient scheduled for an appointment with Dr. Pollyann Kennedy on 01/28/2013 at 1:15 pm. He has been notified of this.

## 2013-02-15 ENCOUNTER — Other Ambulatory Visit: Payer: BC Managed Care – PPO | Admitting: Internal Medicine

## 2013-02-15 DIAGNOSIS — I10 Essential (primary) hypertension: Secondary | ICD-10-CM

## 2013-02-15 DIAGNOSIS — E119 Type 2 diabetes mellitus without complications: Secondary | ICD-10-CM

## 2013-02-15 LAB — HEMOGLOBIN A1C
Hgb A1c MFr Bld: 8.2 % — ABNORMAL HIGH (ref <5.7)
Mean Plasma Glucose: 189 mg/dL — ABNORMAL HIGH (ref <117)

## 2013-02-15 LAB — BASIC METABOLIC PANEL
BUN: 15 mg/dL (ref 6–23)
CO2: 24 mEq/L (ref 19–32)
Calcium: 9.8 mg/dL (ref 8.4–10.5)
Chloride: 97 mEq/L (ref 96–112)
Creat: 1.09 mg/dL (ref 0.50–1.35)
Glucose, Bld: 324 mg/dL — ABNORMAL HIGH (ref 70–99)
Potassium: 4.8 mEq/L (ref 3.5–5.3)
Sodium: 133 mEq/L — ABNORMAL LOW (ref 135–145)

## 2013-02-15 NOTE — Addendum Note (Signed)
Addended by: Judy Pimple on: 02/15/2013 09:58 AM   Modules accepted: Orders

## 2013-02-16 ENCOUNTER — Encounter: Payer: Self-pay | Admitting: Internal Medicine

## 2013-02-16 ENCOUNTER — Ambulatory Visit (INDEPENDENT_AMBULATORY_CARE_PROVIDER_SITE_OTHER): Payer: BC Managed Care – PPO | Admitting: Internal Medicine

## 2013-02-16 ENCOUNTER — Other Ambulatory Visit: Payer: BC Managed Care – PPO | Admitting: Internal Medicine

## 2013-02-16 VITALS — BP 130/80 | HR 88 | Temp 97.3°F | Wt 262.0 lb

## 2013-02-16 DIAGNOSIS — E119 Type 2 diabetes mellitus without complications: Secondary | ICD-10-CM

## 2013-02-16 MED ORDER — METFORMIN HCL 500 MG PO TABS: 500.0000 mg | ORAL_TABLET | Freq: Two times a day (BID) | ORAL | Status: DC

## 2013-02-16 MED ORDER — INSULIN GLARGINE 100 UNIT/ML SOLOSTAR PEN: 10.0000 [IU] | PEN_INJECTOR | Freq: Every evening | SUBCUTANEOUS | Status: DC

## 2013-02-16 NOTE — Patient Instructions (Addendum)
Return in 6 weeks for [facility]

## 2013-02-16 NOTE — Progress Notes (Signed)
  Subjective:    Patient ID: [name] [name], male    DOB: 1951-05-05, 62 y.o.   MRN: [id number]  HPI  In today to follow up on diabetes. Hemoglobin A1c 8.2% and previously was 7.8%. He has been placed on Januvia, continued on glipizide and still is on Lantus 7 units at bedtime. Says Accu-Chek is running about 180-190 in the mornings. Continues to have issues with his back and has appointment see neurosurgeon soon. Says back feels stiff.    Review of Systems     Objective:   Physical Exam Not examined. Urine obtained for microalbumin. Spent 15 minutes speaking with patient about management of diabetes. He's been playing some golf which may be aggravating his back.       Assessment & Plan:  Type 2 diabetes mellitus  Hypertension  Back pain  Plan: Continue same antihypertensive medication. Return in 6 weeks. Continue Januvia. Add metformin 500 mg twice daily. Increase Lantus to 10-12 units at bedtime. Discontinue glipizide.

## 2013-02-17 LAB — MICROALBUMIN, URINE: Microalb, Ur: 0.5 mg/dL (ref 0.00–1.89)

## 2013-02-18 ENCOUNTER — Telehealth: Payer: Self-pay

## 2013-02-18 ENCOUNTER — Ambulatory Visit: Payer: BC Managed Care – PPO | Admitting: Internal Medicine

## 2013-02-18 MED ORDER — RAMIPRIL 10 MG PO CAPS: 10.0000 mg | ORAL_CAPSULE | Freq: Every day | ORAL | Status: DC

## 2013-02-18 NOTE — Telephone Encounter (Signed)
Rx refill done.  

## 2013-03-07 ENCOUNTER — Other Ambulatory Visit: Payer: Self-pay | Admitting: Internal Medicine

## 2013-03-17 ENCOUNTER — Other Ambulatory Visit: Payer: Self-pay | Admitting: Internal Medicine

## 2013-04-02 ENCOUNTER — Other Ambulatory Visit: Payer: BC Managed Care – PPO | Admitting: Internal Medicine

## 2013-04-02 DIAGNOSIS — E119 Type 2 diabetes mellitus without complications: Secondary | ICD-10-CM

## 2013-04-02 LAB — HEMOGLOBIN A1C
Hgb A1c MFr Bld: 8.6 % — ABNORMAL HIGH (ref <5.7)
Mean Plasma Glucose: 200 mg/dL — ABNORMAL HIGH (ref <117)

## 2013-04-05 ENCOUNTER — Ambulatory Visit (INDEPENDENT_AMBULATORY_CARE_PROVIDER_SITE_OTHER): Payer: BC Managed Care – PPO | Admitting: Internal Medicine

## 2013-04-05 ENCOUNTER — Encounter: Payer: Self-pay | Admitting: Internal Medicine

## 2013-04-05 VITALS — BP 130/68 | HR 80 | Wt 263.0 lb

## 2013-04-05 DIAGNOSIS — Z23 Encounter for immunization: Secondary | ICD-10-CM

## 2013-04-05 DIAGNOSIS — E119 Type 2 diabetes mellitus without complications: Secondary | ICD-10-CM

## 2013-04-05 MED ORDER — TETANUS-DIPHTH-ACELL PERTUSSIS 5-2.5-18.5 LF-MCG/0.5 IM SUSP: 0.5000 mL | Freq: Once | INTRAMUSCULAR | Status: DC

## 2013-04-05 NOTE — Patient Instructions (Addendum)
Increase Lantus to 12 units at bedtime. Decreased air consumption the 50%. Return early January for physical exam and fasting lab work. Continue metformin, Januvia, Lantus. Watch diet.

## 2013-04-05 NOTE — Addendum Note (Signed)
Addended by: Judy Pimple on: 04/05/2013 01:44 PM   Modules accepted: Orders

## 2013-04-05 NOTE — Progress Notes (Signed)
  Subjective:    Patient ID: [name] [name], male    DOB: 1950/12/10, 62 y.o.   MRN: [id number]  HPI  In today to followup on type 2 diabetes mellitus. At last visit, discontinue glipizide, started metformin 500 mg twice daily, continue Januvia 100 mg daily, increased Lantus to 10 units at bedtime. Reviewed patient's diet with him today. He eats Cheerios for breakfast. Went over better choices for breakfast with him such as [place] bacon, avoid excess carbs. Has been drinking 6 beers at night. Went over better lunch choices for him.    Review of Systems     Objective:   Physical Exam  Not examined --- Because here  today for management of diabetes. Hemoglobin A1c is 8.6% and previously was 8.2% despite these changes        Assessment & Plan:  Type 2 diabetes mellitus  Hypertension  Hyperlipidemia  Metabolic syndrome  Plan: Patient is to decrease beer consumption by 50%. Increase Lantus to 12 units at bedtime. Continue Januvia and metformin. Physical exam due early January 2015.  Update tetanus immunization today and given flu vaccine.

## 2013-05-10 ENCOUNTER — Other Ambulatory Visit: Payer: Self-pay | Admitting: *Deleted

## 2013-05-10 ENCOUNTER — Other Ambulatory Visit: Payer: Self-pay | Admitting: Internal Medicine

## 2013-05-10 MED ORDER — SITAGLIPTIN PHOSPHATE 100 MG PO TABS: 100.0000 mg | ORAL_TABLET | Freq: Every day | ORAL | Status: DC

## 2013-05-12 ENCOUNTER — Other Ambulatory Visit: Payer: Self-pay | Admitting: *Deleted

## 2013-05-12 MED ORDER — HYDROCODONE-ACETAMINOPHEN 10-325 MG PO TABS: ORAL_TABLET | ORAL | Status: DC

## 2013-06-16 ENCOUNTER — Other Ambulatory Visit: Payer: Self-pay | Admitting: Internal Medicine

## 2013-07-11 ENCOUNTER — Other Ambulatory Visit: Payer: Self-pay | Admitting: Internal Medicine

## 2013-07-21 ENCOUNTER — Encounter: Payer: Self-pay | Admitting: Internal Medicine

## 2013-07-23 ENCOUNTER — Other Ambulatory Visit: Payer: BC Managed Care – PPO | Admitting: Internal Medicine

## 2013-07-23 DIAGNOSIS — Z79899 Other long term (current) drug therapy: Secondary | ICD-10-CM

## 2013-07-23 DIAGNOSIS — Z125 Encounter for screening for malignant neoplasm of prostate: Secondary | ICD-10-CM

## 2013-07-23 DIAGNOSIS — Z130 Encounter for screening for diseases of the blood and blood-forming organs and certain disorders involving the immune mechanism: Secondary | ICD-10-CM

## 2013-07-23 DIAGNOSIS — E119 Type 2 diabetes mellitus without complications: Secondary | ICD-10-CM

## 2013-07-23 DIAGNOSIS — E785 Hyperlipidemia, unspecified: Secondary | ICD-10-CM

## 2013-07-23 LAB — CBC WITH DIFFERENTIAL/PLATELET
Basophils Absolute: 0 10*3/uL (ref 0.0–0.1)
Basophils Relative: 1 % (ref 0–1)
Eosinophils Absolute: 0.1 10*3/uL (ref 0.0–0.7)
Eosinophils Relative: 2 % (ref 0–5)
HCT: 43.2 % (ref 39.0–52.0)
Hemoglobin: 14.7 g/dL (ref 13.0–17.0)
Lymphocytes Relative: 33 % (ref 12–46)
Lymphs Abs: 2.1 10*3/uL (ref 0.7–4.0)
MCH: 29.9 pg (ref 26.0–34.0)
MCHC: 34 g/dL (ref 30.0–36.0)
MCV: 87.8 fL (ref 78.0–100.0)
Monocytes Absolute: 0.6 10*3/uL (ref 0.1–1.0)
Monocytes Relative: 9 % (ref 3–12)
Neutro Abs: 3.5 10*3/uL (ref 1.7–7.7)
Neutrophils Relative %: 55 % (ref 43–77)
Platelets: 189 10*3/uL (ref 150–400)
RBC: 4.92 MIL/uL (ref 4.22–5.81)
RDW: 13.2 % (ref 11.5–15.5)
WBC: 6.4 10*3/uL (ref 4.0–10.5)

## 2013-07-23 LAB — LIPID PANEL
Cholesterol: 138 mg/dL (ref 0–200)
HDL: 31 mg/dL — ABNORMAL LOW (ref >39)
LDL Cholesterol: 72 mg/dL (ref 0–99)
Total CHOL/HDL Ratio: 4.5 Ratio
Triglycerides: 173 mg/dL — ABNORMAL HIGH (ref <150)
VLDL: 35 mg/dL (ref 0–40)

## 2013-07-23 LAB — COMPREHENSIVE METABOLIC PANEL
ALT: 26 U/L (ref 0–53)
AST: 23 U/L (ref 0–37)
Albumin: 4.6 g/dL (ref 3.5–5.2)
Alkaline Phosphatase: 27 U/L — ABNORMAL LOW (ref 39–117)
BUN: 14 mg/dL (ref 6–23)
CO2: 28 mEq/L (ref 19–32)
Calcium: 9.8 mg/dL (ref 8.4–10.5)
Chloride: 100 mEq/L (ref 96–112)
Creat: 1.09 mg/dL (ref 0.50–1.35)
Glucose, Bld: 188 mg/dL — ABNORMAL HIGH (ref 70–99)
Potassium: 4.8 mEq/L (ref 3.5–5.3)
Sodium: 136 mEq/L (ref 135–145)
Total Bilirubin: 0.9 mg/dL (ref 0.3–1.2)
Total Protein: 6.7 g/dL (ref 6.0–8.3)

## 2013-07-23 LAB — HEMOGLOBIN A1C
Hgb A1c MFr Bld: 8.3 % — ABNORMAL HIGH (ref <5.7)
Mean Plasma Glucose: 192 mg/dL — ABNORMAL HIGH (ref <117)

## 2013-07-24 LAB — PSA: PSA: 1.89 ng/mL (ref <=4.00)

## 2013-07-26 ENCOUNTER — Encounter: Payer: Self-pay | Admitting: Internal Medicine

## 2013-07-26 ENCOUNTER — Ambulatory Visit (INDEPENDENT_AMBULATORY_CARE_PROVIDER_SITE_OTHER): Payer: BC Managed Care – PPO | Admitting: Internal Medicine

## 2013-07-26 VITALS — BP 130/82 | HR 76 | Temp 97.7°F | Ht 73.0 in | Wt 260.0 lb

## 2013-07-26 DIAGNOSIS — N529 Male erectile dysfunction, unspecified: Secondary | ICD-10-CM

## 2013-07-26 DIAGNOSIS — Z8639 Personal history of other endocrine, nutritional and metabolic disease: Secondary | ICD-10-CM

## 2013-07-26 DIAGNOSIS — E669 Obesity, unspecified: Secondary | ICD-10-CM

## 2013-07-26 DIAGNOSIS — E1142 Type 2 diabetes mellitus with diabetic polyneuropathy: Secondary | ICD-10-CM

## 2013-07-26 DIAGNOSIS — M545 Low back pain, unspecified: Secondary | ICD-10-CM

## 2013-07-26 DIAGNOSIS — N400 Enlarged prostate without lower urinary tract symptoms: Secondary | ICD-10-CM

## 2013-07-26 DIAGNOSIS — E8881 Metabolic syndrome: Secondary | ICD-10-CM

## 2013-07-26 DIAGNOSIS — Z86010 Personal history of colonic polyps: Secondary | ICD-10-CM

## 2013-07-26 DIAGNOSIS — Z8739 Personal history of other diseases of the musculoskeletal system and connective tissue: Secondary | ICD-10-CM

## 2013-07-26 DIAGNOSIS — E785 Hyperlipidemia, unspecified: Secondary | ICD-10-CM

## 2013-07-26 DIAGNOSIS — IMO0001 Reserved for inherently not codable concepts without codable children: Secondary | ICD-10-CM

## 2013-07-26 DIAGNOSIS — E1165 Type 2 diabetes mellitus with hyperglycemia: Principal | ICD-10-CM

## 2013-07-26 DIAGNOSIS — Z862 Personal history of diseases of the blood and blood-forming organs and certain disorders involving the immune mechanism: Secondary | ICD-10-CM

## 2013-07-26 DIAGNOSIS — I10 Essential (primary) hypertension: Secondary | ICD-10-CM

## 2013-07-26 DIAGNOSIS — Z0000 Encounter for general adult medical examination without abnormal findings: Secondary | ICD-10-CM

## 2013-07-26 DIAGNOSIS — E1149 Type 2 diabetes mellitus with other diabetic neurological complication: Secondary | ICD-10-CM

## 2013-07-26 LAB — POCT URINALYSIS DIPSTICK
Bilirubin, UA: NEGATIVE
Blood, UA: NEGATIVE
Ketones, UA: NEGATIVE
Leukocytes, UA: NEGATIVE
Nitrite, UA: NEGATIVE
Protein, UA: NEGATIVE
Spec Grav, UA: 1.015
Urobilinogen, UA: NEGATIVE
pH, UA: 6.5

## 2013-07-26 MED ORDER — METFORMIN HCL 1000 MG PO TABS: 1000.0000 mg | ORAL_TABLET | Freq: Two times a day (BID) | ORAL | Status: DC

## 2013-07-26 MED ORDER — INSULIN GLARGINE 100 UNIT/ML SOLOSTAR PEN: 15.0000 [IU] | PEN_INJECTOR | Freq: Every day | SUBCUTANEOUS | Status: DC

## 2013-07-26 MED ORDER — HYDROCODONE-ACETAMINOPHEN 10-325 MG PO TABS: ORAL_TABLET | ORAL | Status: DC

## 2013-07-26 MED ORDER — SITAGLIPTIN PHOSPHATE 100 MG PO TABS: ORAL_TABLET | ORAL | Status: DC

## 2013-07-26 NOTE — Progress Notes (Signed)
Subjective:    Patient ID: [name] [name], male    DOB: 1951/05/16, 63 y.o.   MRN: [id number]  HPI  63 year old White male for health maintenance and evaluation of medical issues. Patient has a history of hypertension, hyperlipidemia, obesity, diabetes mellitus, metabolic syndrome. History of depression, erectile dysfunction, gout. History of back pain status post back surgery. History of BPH treated with Flomax.  Past medical history: Colonoscopy in 2004 and 2010. History of adenomatous colon polyps. Repeat study due 2015. Cardiolite study 2004. Pneumovax immunization December 2010. Has eye physician in [facility]. Reminded about annual diabetic eye exam. Patient says he had a cyst removed from his spine in 1977 but this may have actually been a pilonidal cyst. He saw Dr. Daylene Katayama in 2007 and was felt to have degenerative arthritis of the a.c. joint right shoulder. Also had some narrowing of the subacromial output. In March 1999 he had a partial tear of his gastroc muscle left leg treated by Dr. Sharol Given. He is a former patient of Dr. Marilynne Drivers. Has been a patient in this practice since 2004.  History of lumbar disc at L4-L5 and L5-S1 in 1998. Presented with left lower extremity radiculopathy. Received epidural steroids x3 with improvement. In March 2014 he underwent an L2-L3 discectomy by Dr. Arnoldo Morale for right lower extremity radiculopathy and back pain. He's done well since that time.  Has some numbness in his feet from time to time but no difficulty ambulating. Remote history of depression which we have treated with Wellbutrin he remains on that. 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  Social history: He is married. Completed one year of college. Enjoys golf. Does not smoke. Drinks beer. 2 adult children, a son and a daughter. He is a Hydrologist.  Family history: Mother died of stomach cancer at age 44. Father died of lung  cancer at age 76. They apparently died within 6 months of each other when he was 89 or 63 years old. He has 3 sisters in good health.  Tetanus immunization update 2014.       Review of Systems  Constitutional: Negative.   HENT: Negative.   Respiratory: Negative.   Cardiovascular: Negative.   Gastrointestinal: Negative.   Endocrine:       History of type 2 diabetes mellitus  Genitourinary:       Prostatism  Neurological:       Some numbness and lower extremities from time to time not consistently  Hematological: Negative.   Psychiatric/Behavioral:       History of depression treated with Wellbutrin       Objective:   Physical Exam  Vitals reviewed. Constitutional: He is oriented to person, place, and time. He appears well-developed and well-nourished. No distress.  HENT:  Head: Normocephalic and atraumatic.  Right Ear: External ear normal.  Left Ear: External ear normal.  Mouth/Throat: Oropharynx is clear and moist. No oropharyngeal exudate.  Eyes: Conjunctivae and EOM are normal. Pupils are equal, round, and reactive to light. Right eye exhibits no discharge. Left eye exhibits no discharge. No scleral icterus.  Neck: Neck supple. No JVD present. No thyromegaly present.  Cardiovascular: Normal rate, regular rhythm, normal heart sounds and intact distal pulses.   No murmur heard. Pulmonary/Chest: Effort normal and breath sounds normal. No respiratory distress. He has no wheezes. He has no rales. He exhibits no tenderness.  Abdominal: Soft. Bowel sounds are normal. He exhibits no  distension and no mass. There is no tenderness. There is no rebound and no guarding.  Genitourinary: Prostate normal.  Prostate symmetrically enlarged without nodules  Musculoskeletal: Normal range of motion. He exhibits no edema.  Lymphadenopathy:    He has no cervical adenopathy.  Neurological: He is alert and oriented to person, place, and time. He has normal reflexes. He displays normal  reflexes. No cranial nerve deficit. Coordination normal.  Skin: Skin is warm and dry. No rash noted. He is not diaphoretic.  Psychiatric: He has a normal mood and affect. His behavior is normal. Judgment and thought content normal.          Assessment & Plan:  Hypertension-stable-continue current medication  Hyperlipidemia-stable. Needs diet and exercise to be taken seriously. Continue TriCor and statin medication.  Type 2 diabetes mellitus-reminded about annual diabetic eye exam. Is on Lantus and Januvia. Increase metformin to thousand milligrams twice daily and return in February for followup- hemoglobin A1c 8.3% and previously was 0.0%  Metabolic syndrome  Obesity-counsel regarding diet exercise and weight loss  Remote history of depression treated successfully with Wellbutrin. Continue this for now  History of adenomatous colon polyps. Due for repeat colonoscopy 2015.  Probable mild peripheral neuropathy secondary to diabetes  History of BPH-treated with Flomax and stable  History of erectile dysfunction. Treated with Cialis  Low back pain with history of L2-L3 discectomy. Takes Norco from time to time for back pain  Hyperlipidemia treated with statin medication and TriCor  History of gout-treated without Purinol  Plan: Continue same medications return in 6 months. Reminded about annual diabetic eye exam and colonoscopy.

## 2013-07-27 LAB — MICROALBUMIN, URINE: Microalb, Ur: 0.5 mg/dL (ref 0.00–1.89)

## 2013-08-30 ENCOUNTER — Other Ambulatory Visit: Payer: Self-pay | Admitting: Internal Medicine

## 2013-09-07 ENCOUNTER — Other Ambulatory Visit: Payer: BC Managed Care – PPO | Admitting: Internal Medicine

## 2013-09-09 LAB — HEMOGLOBIN A1C
Hgb A1c MFr Bld: 7.6 % — ABNORMAL HIGH (ref <5.7)
Mean Plasma Glucose: 171 mg/dL — ABNORMAL HIGH (ref <117)

## 2013-09-10 NOTE — Progress Notes (Signed)
Patient informed. Will call back when he has a calender.

## 2013-09-10 NOTE — Progress Notes (Signed)
Patient informed. 

## 2013-10-03 ENCOUNTER — Other Ambulatory Visit: Payer: Self-pay | Admitting: Internal Medicine

## 2013-10-10 ENCOUNTER — Other Ambulatory Visit: Payer: Self-pay | Admitting: Internal Medicine

## 2013-10-21 ENCOUNTER — Other Ambulatory Visit: Payer: Self-pay | Admitting: Internal Medicine

## 2013-11-05 ENCOUNTER — Other Ambulatory Visit: Payer: BC Managed Care – PPO | Admitting: Internal Medicine

## 2013-11-05 DIAGNOSIS — IMO0001 Reserved for inherently not codable concepts without codable children: Secondary | ICD-10-CM

## 2013-11-05 DIAGNOSIS — E1165 Type 2 diabetes mellitus with hyperglycemia: Principal | ICD-10-CM

## 2013-11-06 LAB — HEMOGLOBIN A1C
Hgb A1c MFr Bld: 6.9 % — ABNORMAL HIGH (ref <5.7)
Mean Plasma Glucose: 151 mg/dL — ABNORMAL HIGH (ref <117)

## 2013-12-02 ENCOUNTER — Other Ambulatory Visit: Payer: Self-pay | Admitting: Dermatology

## 2014-01-19 ENCOUNTER — Encounter: Payer: Self-pay | Admitting: Internal Medicine

## 2014-01-19 NOTE — Patient Instructions (Addendum)
Watch diet and try to exercise. Watch. Consumption. Have annual diabetic eye exam. Return in 6 months for followup. Need to lose weight. Continue same medications. Continue Januvia and Lantus insulin. Take metformin 1000 mg twice daily. Return for followup February regarding diabetes.

## 2014-01-25 ENCOUNTER — Other Ambulatory Visit: Payer: BC Managed Care – PPO | Admitting: Internal Medicine

## 2014-01-25 DIAGNOSIS — Z79899 Other long term (current) drug therapy: Secondary | ICD-10-CM

## 2014-01-25 DIAGNOSIS — E785 Hyperlipidemia, unspecified: Secondary | ICD-10-CM

## 2014-01-25 DIAGNOSIS — E119 Type 2 diabetes mellitus without complications: Secondary | ICD-10-CM

## 2014-01-25 LAB — HEMOGLOBIN A1C
Hgb A1c MFr Bld: 6.8 % — ABNORMAL HIGH (ref <5.7)
Mean Plasma Glucose: 148 mg/dL — ABNORMAL HIGH (ref <117)

## 2014-01-26 LAB — LIPID PANEL
Cholesterol: 137 mg/dL (ref 0–200)
HDL: 35 mg/dL — ABNORMAL LOW (ref >39)
LDL Cholesterol: 78 mg/dL (ref 0–99)
Total CHOL/HDL Ratio: 3.9 Ratio
Triglycerides: 120 mg/dL (ref <150)
VLDL: 24 mg/dL (ref 0–40)

## 2014-01-26 LAB — HEPATIC FUNCTION PANEL
ALT: 27 U/L (ref 0–53)
AST: 24 U/L (ref 0–37)
Albumin: 4.4 g/dL (ref 3.5–5.2)
Alkaline Phosphatase: 20 U/L — ABNORMAL LOW (ref 39–117)
Bilirubin, Direct: 0.1 mg/dL (ref 0.0–0.3)
Indirect Bilirubin: 0.4 mg/dL (ref 0.2–1.2)
Total Bilirubin: 0.5 mg/dL (ref 0.2–1.2)
Total Protein: 6.2 g/dL (ref 6.0–8.3)

## 2014-01-27 ENCOUNTER — Encounter: Payer: Self-pay | Admitting: Internal Medicine

## 2014-01-27 ENCOUNTER — Ambulatory Visit (INDEPENDENT_AMBULATORY_CARE_PROVIDER_SITE_OTHER): Payer: BC Managed Care – PPO | Admitting: Internal Medicine

## 2014-01-27 VITALS — BP 130/80 | HR 76 | Wt 258.0 lb

## 2014-01-27 DIAGNOSIS — E8881 Metabolic syndrome: Secondary | ICD-10-CM

## 2014-01-27 DIAGNOSIS — E669 Obesity, unspecified: Secondary | ICD-10-CM

## 2014-01-27 DIAGNOSIS — I10 Essential (primary) hypertension: Secondary | ICD-10-CM

## 2014-01-27 DIAGNOSIS — E119 Type 2 diabetes mellitus without complications: Secondary | ICD-10-CM

## 2014-01-27 DIAGNOSIS — E785 Hyperlipidemia, unspecified: Secondary | ICD-10-CM

## 2014-01-27 MED ORDER — INSULIN GLARGINE 100 UNIT/ML SOLOSTAR PEN: 18.0000 [IU] | PEN_INJECTOR | Freq: Every day | SUBCUTANEOUS | Status: DC

## 2014-01-27 NOTE — Progress Notes (Signed)
   Subjective:    Patient ID: [name] [name], male    DOB: 1950-12-07, 63 y.o.   MRN: [id number]  HPI In today for six-month recheck. History of diabetes mellitus, hypertension, hyperlipidemia, obesity, metabolic syndrome. History of erectile dysfunction. No complaints today of back pain. Is status post back surgery March 2014. Enjoys playing golf. Blood pressure is under reasonable control with current regimen. He's only lost 2 pounds since last visit in January. Says he is eating better. I'm pleased with his lipid panel and liver functions. He is on Lipitor. Hemoglobin A1c has improved to 6.8% from 6.9%.    Review of Systems     Objective:   Physical Exam  Skin is warm and dry. Nodes none. Neck is supple without JVD thyromegaly or carotid bruits. Chest clear to auscultation. Cardiac exam: regular rate and rhythm normal S1 and S2. Extremities: without edema. Has a superficial varicosity medial aspect of right leg and thigh This is not tender and not red. Diabetic foot exam: He has a small callus underneath second toe and MTP joint. It is not infected. Remainder foot exam is negative. Pulses are normal.      Assessment & Plan:  Hypertension-stable on current regimen  Hyperlipidemia-stable on lipid-lowering medication  Diabetes mellitus-controlled with current medication  Metabolic syndrome  Obesity-counsel regarding diet exercise and weight loss  Erectile dysfunction-samples of Cialis given  History of back pain-no complaint today  Plan: Continue same medications return in 6 months for physical examination.

## 2014-01-27 NOTE — Patient Instructions (Signed)
Patient counseled regarding diet exercise and weight loss efforts. Return in 6 months for physical examination. Continue same medications.

## 2014-02-20 ENCOUNTER — Other Ambulatory Visit: Payer: Self-pay | Admitting: Internal Medicine

## 2014-03-22 ENCOUNTER — Other Ambulatory Visit: Payer: Self-pay | Admitting: Internal Medicine

## 2014-04-11 ENCOUNTER — Encounter: Payer: Self-pay | Admitting: Internal Medicine

## 2014-04-11 ENCOUNTER — Other Ambulatory Visit: Payer: Self-pay | Admitting: Internal Medicine

## 2014-05-09 ENCOUNTER — Encounter: Payer: Self-pay | Admitting: Internal Medicine

## 2014-05-19 ENCOUNTER — Other Ambulatory Visit: Payer: Self-pay | Admitting: Internal Medicine

## 2014-06-19 ENCOUNTER — Other Ambulatory Visit: Payer: Self-pay | Admitting: Internal Medicine

## 2014-07-15 HISTORY — PX: COLONOSCOPY: SHX174

## 2014-07-20 ENCOUNTER — Other Ambulatory Visit: Payer: Self-pay | Admitting: *Deleted

## 2014-07-20 MED ORDER — SITAGLIPTIN PHOSPHATE 100 MG PO TABS: ORAL_TABLET | ORAL | Status: DC

## 2014-07-20 NOTE — Telephone Encounter (Signed)
Refill on Januvia sent electronically

## 2014-07-22 ENCOUNTER — Other Ambulatory Visit: Payer: Self-pay | Admitting: *Deleted

## 2014-07-22 NOTE — Telephone Encounter (Signed)
Patient Insurance company is going to fax prior authorization form to be completed

## 2014-07-22 NOTE — Telephone Encounter (Signed)
Completed prior authorization form and faxed back awaiting response

## 2014-07-26 NOTE — Telephone Encounter (Signed)
[employer] denied patient pre-authorization for Januvia Dr Renold Genta will address this with patient at his appt on 07/29/14

## 2014-07-28 ENCOUNTER — Other Ambulatory Visit: Payer: 59 | Admitting: Internal Medicine

## 2014-07-28 ENCOUNTER — Other Ambulatory Visit: Payer: Self-pay | Admitting: Internal Medicine

## 2014-07-28 DIAGNOSIS — Z0000 Encounter for general adult medical examination without abnormal findings: Secondary | ICD-10-CM

## 2014-07-28 DIAGNOSIS — Z130 Encounter for screening for diseases of the blood and blood-forming organs and certain disorders involving the immune mechanism: Secondary | ICD-10-CM

## 2014-07-28 DIAGNOSIS — Z125 Encounter for screening for malignant neoplasm of prostate: Secondary | ICD-10-CM

## 2014-07-28 DIAGNOSIS — Z13220 Encounter for screening for lipoid disorders: Secondary | ICD-10-CM

## 2014-07-28 LAB — LIPID PANEL
Cholesterol: 137 mg/dL (ref 0–200)
HDL: 32 mg/dL — ABNORMAL LOW (ref >39)
LDL Cholesterol: 83 mg/dL (ref 0–99)
Total CHOL/HDL Ratio: 4.3 Ratio
Triglycerides: 110 mg/dL (ref <150)
VLDL: 22 mg/dL (ref 0–40)

## 2014-07-28 LAB — COMPREHENSIVE METABOLIC PANEL
ALT: 29 U/L (ref 0–53)
AST: 25 U/L (ref 0–37)
Albumin: 4.3 g/dL (ref 3.5–5.2)
Alkaline Phosphatase: 21 U/L — ABNORMAL LOW (ref 39–117)
BUN: 12 mg/dL (ref 6–23)
CO2: 28 mEq/L (ref 19–32)
Calcium: 9.4 mg/dL (ref 8.4–10.5)
Chloride: 100 mEq/L (ref 96–112)
Creat: 1.02 mg/dL (ref 0.50–1.35)
Glucose, Bld: 98 mg/dL (ref 70–99)
Potassium: 4.5 mEq/L (ref 3.5–5.3)
Sodium: 137 mEq/L (ref 135–145)
Total Bilirubin: 0.9 mg/dL (ref 0.2–1.2)
Total Protein: 6.5 g/dL (ref 6.0–8.3)

## 2014-07-28 LAB — CBC WITH DIFFERENTIAL/PLATELET
Basophils Absolute: 0.1 10*3/uL (ref 0.0–0.1)
Basophils Relative: 1 % (ref 0–1)
Eosinophils Absolute: 0.1 10*3/uL (ref 0.0–0.7)
Eosinophils Relative: 2 % (ref 0–5)
HCT: 43.2 % (ref 39.0–52.0)
Hemoglobin: 14.6 g/dL (ref 13.0–17.0)
Lymphocytes Relative: 39 % (ref 12–46)
Lymphs Abs: 2.4 10*3/uL (ref 0.7–4.0)
MCH: 30.3 pg (ref 26.0–34.0)
MCHC: 33.8 g/dL (ref 30.0–36.0)
MCV: 89.6 fL (ref 78.0–100.0)
MPV: 11.1 fL (ref 8.6–12.4)
Monocytes Absolute: 0.5 10*3/uL (ref 0.1–1.0)
Monocytes Relative: 8 % (ref 3–12)
Neutro Abs: 3.1 10*3/uL (ref 1.7–7.7)
Neutrophils Relative %: 50 % (ref 43–77)
Platelets: 191 10*3/uL (ref 150–400)
RBC: 4.82 MIL/uL (ref 4.22–5.81)
RDW: 13.7 % (ref 11.5–15.5)
WBC: 6.1 10*3/uL (ref 4.0–10.5)

## 2014-07-29 ENCOUNTER — Other Ambulatory Visit: Payer: Self-pay | Admitting: Internal Medicine

## 2014-07-29 ENCOUNTER — Ambulatory Visit (INDEPENDENT_AMBULATORY_CARE_PROVIDER_SITE_OTHER): Payer: 59 | Admitting: Internal Medicine

## 2014-07-29 ENCOUNTER — Encounter: Payer: Self-pay | Admitting: Internal Medicine

## 2014-07-29 VITALS — BP 148/78 | HR 76 | Temp 97.8°F | Wt 258.0 lb

## 2014-07-29 DIAGNOSIS — I10 Essential (primary) hypertension: Secondary | ICD-10-CM

## 2014-07-29 DIAGNOSIS — E119 Type 2 diabetes mellitus without complications: Secondary | ICD-10-CM

## 2014-07-29 DIAGNOSIS — E669 Obesity, unspecified: Secondary | ICD-10-CM

## 2014-07-29 DIAGNOSIS — M7711 Lateral epicondylitis, right elbow: Secondary | ICD-10-CM

## 2014-07-29 DIAGNOSIS — Z0000 Encounter for general adult medical examination without abnormal findings: Secondary | ICD-10-CM

## 2014-07-29 DIAGNOSIS — E785 Hyperlipidemia, unspecified: Secondary | ICD-10-CM

## 2014-07-29 DIAGNOSIS — S46811A Strain of other muscles, fascia and tendons at shoulder and upper arm level, right arm, initial encounter: Secondary | ICD-10-CM

## 2014-07-29 DIAGNOSIS — E8881 Metabolic syndrome: Secondary | ICD-10-CM

## 2014-07-29 LAB — POCT URINALYSIS DIPSTICK
Bilirubin, UA: NEGATIVE
Blood, UA: NEGATIVE
Glucose, UA: NEGATIVE
Ketones, UA: NEGATIVE
Leukocytes, UA: NEGATIVE
Nitrite, UA: NEGATIVE
Protein, UA: NEGATIVE
Spec Grav, UA: 1.015
Urobilinogen, UA: NEGATIVE
pH, UA: 6.5

## 2014-07-29 LAB — HEMOGLOBIN A1C
Hgb A1c MFr Bld: 6.4 % — ABNORMAL HIGH (ref <5.7)
Mean Plasma Glucose: 137 mg/dL — ABNORMAL HIGH (ref <117)

## 2014-07-29 LAB — PSA: PSA: 2.04 ng/mL (ref <=4.00)

## 2014-07-29 MED ORDER — LINAGLIPTIN 5 MG PO TABS: 5.0000 mg | ORAL_TABLET | Freq: Every day | ORAL | Status: DC

## 2014-07-29 NOTE — Progress Notes (Signed)
Subjective:    Patient ID: [name] [name], male    DOB: 1950/11/06, 64 y.o.   MRN: [id number]  HPI 64 year old White Male in today for health maintenance exam and evaluation of medical problems. Patient has a history of essential hypertension, hyperlipidemia, obesity, type 2 diabetes mellitus, metabolic syndrome. History of depression, erectile dysfunction, gout. History of back pain status post L2-L3 dissectomy  March 2014 for right lower extremity radiculopathy and back pain. History of BPH treated with Flomax.  Past medical history: Colonoscopy 2010. History of adenomatous colon polyps. Repeat study due to. Cardiolite study 2004 which was negative. Imovax immunization 2010. Reminded about annual diabetic eye exam. Eye physician is in [facility].  Dr. Daylene Katayama saw him in 2007 felt to have degenerative arthritis in the acromioclavicular joint of the right shoulder. In March 1999 he had a partial tear of his gastroc muscle and left leg treated by Dr. Sharol Given.  Patient says he had a cyst removed from his spine in 1977 that this may have actually been a pilonidal cyst.  History of lumbar disc at L4-L5 and L5-S1 in 1998. Presented with left lower extremity radiculopathy. Received epidural steroids 3 with improvement.  He has some numbness in his feet but no difficulty ambulating. History of depression treated with Wellbutrin.  Social history: He is married. Completed one year college. Enjoys golf. Does not smoke. Drinks beer. 2 adult children, a son and a daughter. He is a self-employed Hydrologist.  Tetanus immunization update 2014.  Family history: Mother died of stomach cancer at age 56. Father died of lung cancer at age 91. They apparently died within 6 months of each other when he was 30 or 17 years old. 3 sisters in good health.  New complaint today is pain  right lateral elbow and right trapezius muscle. Hurts to pick up heavy objects with right eye and. He is right handed. No numbness  or tingling in right upper extremity.      Review of Systems  Constitutional: Negative.   HENT: Negative.   Eyes: Negative.   Respiratory: Negative.   Cardiovascular: Negative.   Gastrointestinal: Negative.   Genitourinary:       History of erectile dysfunction and BPH  Neurological:       Numbness in feet particularly great toes  Hematological: Negative.   Psychiatric/Behavioral: Negative.        Objective:   Physical Exam  Constitutional: He is oriented to person, place, and time. He appears well-developed and well-nourished. No distress.  HENT:  Head: Normocephalic and atraumatic.  Right Ear: External ear normal.  Left Ear: External ear normal.  Mouth/Throat: Oropharynx is clear and moist. No oropharyngeal exudate.  Eyes: Conjunctivae are normal. Pupils are equal, round, and reactive to light. Right eye exhibits no discharge. Left eye exhibits no discharge. No scleral icterus.  Neck: Neck supple. No JVD present. No thyromegaly present.  Cardiovascular: Normal rate, normal heart sounds and intact distal pulses.   No murmur heard. Pulmonary/Chest: Effort normal and breath sounds normal. No respiratory distress. He has no wheezes.  Abdominal: Soft. Bowel sounds are normal. He exhibits no distension. There is no tenderness. There is no rebound and no guarding.  Genitourinary:  Prostate enlarged without nodules  Musculoskeletal: Normal range of motion. He exhibits no edema.  Lymphadenopathy:    He has no cervical adenopathy.  Neurological: He is alert and oriented to person, place, and time. He has normal reflexes. No cranial nerve deficit.  Skin: No rash  noted. He is not diaphoretic.  Psychiatric: He has a normal mood and affect. His behavior is normal. Judgment and thought content normal.  Vitals reviewed.         Assessment & Plan:  Right lateral epicondylitis-has tennis elbow splint at home  Musculoskeletal pain right trapezius muscle-recommend ice 20 minutes  twice daily to muscle. Reevaluate if this does not improve  Type 2 diabetes mellitus-hemoglobin A1c pending. His insurance coming will not pay for Januvia. Try Trajenta 5 mg daily instead of Januvia.  BPH-treated with Flomax  Peripheral neuropathy in feet secondary to diabetes  Hypertension- continue same medication. Monitor blood pressure at home  Hyperlipidemia-stable on lipid-lowering medication  Obesity-encouraged diet and exercise.  History of depression treated with Wellbutrin. Symptoms are stable.  History of adenomatous colon polyps. Due for repeat colonoscopy. Order placed in Epic  History of erectile dysfunction-treated with Cialis.  History of gout treated with allopurinol with no recent recurrences.  Plan: Return in 6 months for office visit lipid panel liver functions and hemoglobin A1c. Reminded about annual diabetic eye exam.  45 minutes spent with patient with education regarding  diet and exercise in addition to review chart and discussion regarding medical problems and new complaint of epicondylitis and shoulder pain

## 2014-07-29 NOTE — Patient Instructions (Signed)
Try Trajenta instead of Januvia. Monitor Accu-Cheks. Call if Accu-Cheks are elevated on new medication. Ice to right trapezius muscle. Wear tennis elbow splint right arm. Diet exercise and weight loss. Recommend annual diabetic eye exam. Time for colonoscopy. Return in 6 months.

## 2014-07-30 LAB — MICROALBUMIN / CREATININE URINE RATIO
Creatinine, Urine: 94.1 mg/dL
Microalb Creat Ratio: 6.4 mg/g (ref 0.0–30.0)
Microalb, Ur: 0.6 mg/dL (ref <2.0)

## 2014-08-01 ENCOUNTER — Telehealth: Payer: Self-pay | Admitting: *Deleted

## 2014-08-01 NOTE — Telephone Encounter (Signed)
Reviewed lab results with patient.

## 2014-08-11 ENCOUNTER — Other Ambulatory Visit: Payer: Self-pay | Admitting: Internal Medicine

## 2014-09-25 ENCOUNTER — Other Ambulatory Visit: Payer: Self-pay | Admitting: Internal Medicine

## 2014-09-26 ENCOUNTER — Other Ambulatory Visit: Payer: Self-pay | Admitting: *Deleted

## 2014-09-26 MED ORDER — FENOFIBRATE 145 MG PO TABS: 145.0000 mg | ORAL_TABLET | Freq: Every day | ORAL | Status: DC

## 2014-09-26 NOTE — Telephone Encounter (Signed)
Refill sent for Tricor

## 2014-10-02 ENCOUNTER — Other Ambulatory Visit: Payer: Self-pay | Admitting: Internal Medicine

## 2014-10-06 ENCOUNTER — Other Ambulatory Visit: Payer: Self-pay | Admitting: *Deleted

## 2014-10-06 MED ORDER — FENOFIBRATE 160 MG PO TABS: 160.0000 mg | ORAL_TABLET | Freq: Every day | ORAL | Status: DC

## 2014-10-06 NOTE — Telephone Encounter (Signed)
Changed fenofibrate dose from 145mg  to 160mg  per Dr Renold Genta for insurance coverage

## 2014-10-24 ENCOUNTER — Other Ambulatory Visit: Payer: Self-pay | Admitting: Internal Medicine

## 2015-01-18 ENCOUNTER — Other Ambulatory Visit: Payer: Self-pay | Admitting: Internal Medicine

## 2015-01-24 ENCOUNTER — Other Ambulatory Visit: Payer: 59 | Admitting: Internal Medicine

## 2015-01-24 DIAGNOSIS — E785 Hyperlipidemia, unspecified: Secondary | ICD-10-CM

## 2015-01-24 DIAGNOSIS — E119 Type 2 diabetes mellitus without complications: Secondary | ICD-10-CM

## 2015-01-24 DIAGNOSIS — Z79899 Other long term (current) drug therapy: Secondary | ICD-10-CM

## 2015-01-24 LAB — LIPID PANEL
Cholesterol: 126 mg/dL (ref 0–200)
HDL: 33 mg/dL — ABNORMAL LOW (ref >=40)
LDL Cholesterol: 66 mg/dL (ref 0–99)
Total CHOL/HDL Ratio: 3.8 Ratio
Triglycerides: 136 mg/dL (ref <150)
VLDL: 27 mg/dL (ref 0–40)

## 2015-01-24 LAB — HEPATIC FUNCTION PANEL
ALT: 27 U/L (ref 0–53)
AST: 23 U/L (ref 0–37)
Albumin: 4.4 g/dL (ref 3.5–5.2)
Alkaline Phosphatase: 22 U/L — ABNORMAL LOW (ref 39–117)
Bilirubin, Direct: 0.2 mg/dL (ref 0.0–0.3)
Indirect Bilirubin: 0.5 mg/dL (ref 0.2–1.2)
Total Bilirubin: 0.7 mg/dL (ref 0.2–1.2)
Total Protein: 6.2 g/dL (ref 6.0–8.3)

## 2015-01-24 LAB — HEMOGLOBIN A1C
Hgb A1c MFr Bld: 6.8 % — ABNORMAL HIGH (ref <5.7)
Mean Plasma Glucose: 148 mg/dL — ABNORMAL HIGH (ref <117)

## 2015-01-27 ENCOUNTER — Ambulatory Visit (INDEPENDENT_AMBULATORY_CARE_PROVIDER_SITE_OTHER): Payer: 59 | Admitting: Internal Medicine

## 2015-01-27 ENCOUNTER — Encounter: Payer: Self-pay | Admitting: Internal Medicine

## 2015-01-27 VITALS — BP 140/84 | HR 78 | Temp 97.5°F | Wt 259.0 lb

## 2015-01-27 DIAGNOSIS — E785 Hyperlipidemia, unspecified: Secondary | ICD-10-CM | POA: Diagnosis not present

## 2015-01-27 DIAGNOSIS — Z0000 Encounter for general adult medical examination without abnormal findings: Secondary | ICD-10-CM | POA: Diagnosis not present

## 2015-01-27 DIAGNOSIS — I10 Essential (primary) hypertension: Secondary | ICD-10-CM

## 2015-01-27 DIAGNOSIS — E8881 Metabolic syndrome: Secondary | ICD-10-CM

## 2015-01-27 DIAGNOSIS — E119 Type 2 diabetes mellitus without complications: Secondary | ICD-10-CM | POA: Diagnosis not present

## 2015-01-27 DIAGNOSIS — E669 Obesity, unspecified: Secondary | ICD-10-CM

## 2015-01-27 NOTE — Patient Instructions (Signed)
Work on diet exercise and weight loss. Continue same medications. Return for physical exam in 6 months. Reminded about diabetic eye exam.

## 2015-01-27 NOTE — Progress Notes (Signed)
   Subjective:    Patient ID: [name] [name], male    DOB: 1950-07-18, 64 y.o.   MRN: [id number]  HPI 64 year old Male in today for six-month recheck on hypertension, controlled type 2 diabetes mellitus, hyperlipidemia, obesity, metabolic syndrome.  Says he is doing well overall. Unfortunately he has gained 1 pound since physical exam in January. Needs to diet exercise and lose weight. He has a history of depression. This seems to be better. 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  No new complaints or problems.  Lab work reviewed with him. Hemoglobin A1c is 6.8% and previously was 6.4%. Lipid panel and liver functions are within normal limits.    Review of Systems     Objective:   Physical Exam  Skin: Tan, warm and dry. Nodes none. Neck is supple without JVD thyromegaly or carotid bruits. Chest clear to auscultation. Cardiac exam regular rate and rhythm normal S1 and S2. Extremities without edema      Assessment & Plan:   Obesity-encouraged diet exercise and weight loss  Metabolic syndrome  Controlled type 2 diabetes mellitus-stable medication. Continue diet exercise and weight loss efforts  Hyperlipidemia-stable on statin medication  Essential hypertension-stable on medication  Plan: Patient we do for physical exam in 6 months. Continue same medications. Reminded about diabetic eye exam.

## 2015-01-30 ENCOUNTER — Encounter: Payer: Self-pay | Admitting: Internal Medicine

## 2015-02-16 ENCOUNTER — Encounter: Payer: Self-pay | Admitting: Gastroenterology

## 2015-03-22 ENCOUNTER — Encounter: Payer: Self-pay | Admitting: Internal Medicine

## 2015-03-25 ENCOUNTER — Other Ambulatory Visit: Payer: Self-pay | Admitting: Internal Medicine

## 2015-03-27 NOTE — Telephone Encounter (Signed)
Refill x one year °

## 2015-04-12 ENCOUNTER — Ambulatory Visit (AMBULATORY_SURGERY_CENTER): Payer: Self-pay | Admitting: *Deleted

## 2015-04-12 ENCOUNTER — Other Ambulatory Visit: Payer: Self-pay | Admitting: Internal Medicine

## 2015-04-12 VITALS — Ht 74.0 in | Wt 261.0 lb

## 2015-04-12 DIAGNOSIS — Z86010 Personal history of colonic polyps: Secondary | ICD-10-CM

## 2015-04-12 MED ORDER — NA SULFATE-K SULFATE-MG SULF 17.5-3.13-1.6 GM/177ML PO SOLN: 1.0000 | Freq: Once | ORAL | Status: DC

## 2015-04-26 ENCOUNTER — Ambulatory Visit (AMBULATORY_SURGERY_CENTER): Payer: 59 | Admitting: Gastroenterology

## 2015-04-26 ENCOUNTER — Encounter: Payer: Self-pay | Admitting: Gastroenterology

## 2015-04-26 VITALS — BP 116/85 | HR 69 | Temp 96.0°F | Resp 19 | Ht 74.0 in | Wt 261.0 lb

## 2015-04-26 DIAGNOSIS — D128 Benign neoplasm of rectum: Secondary | ICD-10-CM

## 2015-04-26 DIAGNOSIS — D120 Benign neoplasm of cecum: Secondary | ICD-10-CM | POA: Diagnosis not present

## 2015-04-26 DIAGNOSIS — D123 Benign neoplasm of transverse colon: Secondary | ICD-10-CM

## 2015-04-26 DIAGNOSIS — Z86010 Personal history of colonic polyps: Secondary | ICD-10-CM

## 2015-04-26 DIAGNOSIS — K621 Rectal polyp: Secondary | ICD-10-CM

## 2015-04-26 DIAGNOSIS — D122 Benign neoplasm of ascending colon: Secondary | ICD-10-CM

## 2015-04-26 MED ORDER — SODIUM CHLORIDE 0.9 % IV SOLN: 500.0000 mL | INTRAVENOUS | Status: DC

## 2015-04-26 NOTE — Patient Instructions (Signed)
YOU HAD AN ENDOSCOPIC PROCEDURE TODAY AT [facility]:   Refer to the procedure report that was given to you for any specific questions about what was found during the examination.  If the procedure report does not answer your questions, please call your gastroenterologist to clarify.  If you requested that your care partner not be given the details of your procedure findings, then the procedure report has been included in a sealed envelope for you to review at your convenience later.  YOU SHOULD EXPECT: Some feelings of bloating in the abdomen. Passage of more gas than usual.  Walking can help get rid of the air that was put into your GI tract during the procedure and reduce the bloating. If you had a lower endoscopy (such as a colonoscopy or flexible sigmoidoscopy) you may notice spotting of blood in your stool or on the toilet paper. If you underwent a bowel prep for your procedure, you may not have a normal bowel movement for a few days.  Please Note:  You might notice some irritation and congestion in your nose or some drainage.  This is from the oxygen used during your procedure.  There is no need for concern and it should clear up in a day or so.  SYMPTOMS TO REPORT IMMEDIATELY:   Following lower endoscopy (colonoscopy or flexible sigmoidoscopy):  Excessive amounts of blood in the stool  Significant tenderness or worsening of abdominal pains  Swelling of the abdomen that is new, acute  Fever of 100F or higher   For urgent or emergent issues, a gastroenterologist can be reached at any hour by calling [phone].   DIET: Your first meal following the procedure should be a small meal and then it is ok to progress to your normal diet. Heavy or fried foods are harder to digest and may make you feel nauseous or bloated.  Likewise, meals heavy in dairy and vegetables can increase bloating.  Drink plenty of fluids but you should avoid alcoholic beverages for 24  hours.  ACTIVITY:  You should plan to take it easy for the rest of today and you should NOT DRIVE or use heavy machinery until tomorrow (because of the sedation medicines used during the test).    FOLLOW UP: Our staff will call the number listed on your records the next business day following your procedure to check on you and address any questions or concerns that you may have regarding the information given to you following your procedure. If we do not reach you, we will leave a message.  However, if you are feeling well and you are not experiencing any problems, there is no need to return our call.  We will assume that you have returned to your regular daily activities without incident.  If any biopsies were taken you will be contacted by phone or by letter within the next 1-3 weeks.  Please call us at [phone] if you have not heard about the biopsies in 3 weeks.    SIGNATURES/CONFIDENTIALITY: You and/or your care partner have signed paperwork which will be entered into your electronic medical record.  These signatures attest to the fact that that the information above on your After Visit Summary has been reviewed and is understood.  Full responsibility of the confidentiality of this discharge information lies with you and/or your care-partner.    Handouts were given to your care partner on polyps, diverticulosis, and a high fiber diet with liberal fluid intake. You blood sugar  was 126 in the recovery room. You may resume your current medications today. Await biopsy results. Please call if any questions or concerns.

## 2015-04-26 NOTE — Op Note (Signed)
[place]  [address] [place], [address]   COLONOSCOPY PROCEDURE REPORT  PATIENT: [name] [name]  MR#: [id number] BIRTHDATE: October 10, 1950 , 64  yrs. old GENDER: male ENDOSCOPIST: Yetta Flock, MD REFERRED BY: Tedra Senegal MD PROCEDURE DATE:  04/26/2015 PROCEDURE:   Colonoscopy with snare polypectomy, Colonoscopy with biopsy, and Colonoscopy, surveillance First Screening Colonoscopy - Avg.  risk and is 50 yrs.  old or older - No.  Prior Negative Screening - Now for repeat screening. N/A  History of Adenoma - Now for follow-up colonoscopy & has been > or = to 3 yrs.  Yes hx of adenoma.  Has been 3 or more years since last colonoscopy.  Polyps removed today? Yes ASA CLASS:   Class II INDICATIONS:Surveillance due to prior colonic neoplasia and Colorectal Neoplasm Risk Assessment for this procedure is average risk. MEDICATIONS: Propofol 400 mg IV  DESCRIPTION OF PROCEDURE:   After the risks benefits and alternatives of the procedure were thoroughly explained, informed consent was obtained.  The digital rectal exam revealed no abnormalities of the rectum.   The LB CF-H180AL Loaner [id number] endoscope was introduced through the anus and advanced to the cecum, which was identified by both the appendix and ileocecal valve. No adverse events experienced.   The quality of the prep was adequate  The instrument was then slowly withdrawn as the colon was fully examined. Estimated blood loss is zero unless otherwise noted in this procedure report.   COLON FINDINGS: A sessile polyp measuring 6 mm in size was found at the cecum.  A polypectomy was performed with a cold snare.  The resection was complete, the polyp tissue was completely retrieved and sent to histology.   A sessile polyp measuring 3 mm in size was found in the ascending colon.  A polypectomy was performed with cold forceps.  The resection was complete, the polyp tissue was completely retrieved and  sent to histology.   Two sessile polyps measuring 3 mm in size were found in the transverse colon. Polypectomies were performed with cold forceps.  The resection was complete, the polyp tissue was completely retrieved and sent to histology.   Three sessile polyps measuring 3 mm in size were found in the rectum.  Polypectomies were performed with cold forceps. The resection was complete, the polyp tissue was completely retrieved and sent to histology.   There was moderate diverticulosis noted throughout the entire examined colon.   The examination was otherwise normal.  Retroflexed views revealed internal hemorrhoids. The time to cecum = 2.7 Withdrawal time = 21.0   The scope was withdrawn and the procedure completed. COMPLICATIONS: There were no immediate complications.  ENDOSCOPIC IMPRESSION: 1.   Sessile polyp was found at the cecum; polypectomy was performed with a cold snare 2.   Sessile polyp was found in the ascending colon; polypectomy was performed with cold forceps 3.   Two sessile polyps were found in the transverse colon; polypectomies were performed with cold forceps 4.   Three sessile polyps were found in the rectum; polypectomies were performed with cold forceps 5.   Moderate diverticulosis was noted throughout the entire examined colon 6.   The examination was otherwise normal  RECOMMENDATIONS: 1.  Await pathology results 2.  Resume diet 3.  Resume medications  eSigned:  Yetta Flock, MD 04/26/2015 9:32 AM   cc: Tedra Senegal MD, the patient   PATIENT NAME:  [name] [name] MR#: [id number]

## 2015-04-26 NOTE — Progress Notes (Signed)
Report to PACU, RN, vss, BBS= Clear.  

## 2015-04-26 NOTE — Progress Notes (Signed)
Called to room to assist during endoscopic procedure.  Patient ID and intended procedure confirmed with present staff. Received instructions for my participation in the procedure from the performing physician.  

## 2015-04-26 NOTE — Progress Notes (Signed)
No problems noted in the recovery room. maw 

## 2015-04-27 ENCOUNTER — Telehealth: Payer: Self-pay | Admitting: *Deleted

## 2015-04-27 NOTE — Telephone Encounter (Signed)
  Follow up Call-  Call back number 04/26/2015  Post procedure Call Back phone  # [phone]  Permission to leave phone message Yes     Patient questions:  Do you have a fever, pain , or abdominal swelling? No. Pain Score  0 *  Have you tolerated food without any problems? Yes.    Have you been able to return to your normal activities? Yes.    Do you have any questions about your discharge instructions: Diet   No. Medications  No. Follow up visit  No.  Do you have questions or concerns about your Care? No.  Actions: * If pain score is 4 or above: No action needed, pain <4.

## 2015-05-03 ENCOUNTER — Encounter: Payer: Self-pay | Admitting: Gastroenterology

## 2015-05-14 ENCOUNTER — Other Ambulatory Visit: Payer: Self-pay | Admitting: Internal Medicine

## 2015-05-28 ENCOUNTER — Other Ambulatory Visit: Payer: Self-pay | Admitting: Internal Medicine

## 2015-05-29 ENCOUNTER — Other Ambulatory Visit: Payer: Self-pay | Admitting: Internal Medicine

## 2015-07-11 ENCOUNTER — Other Ambulatory Visit: Payer: Self-pay

## 2015-07-11 MED ORDER — ALLOPURINOL 300 MG PO TABS: 300.0000 mg | ORAL_TABLET | Freq: Every day | ORAL | Status: DC

## 2015-07-31 ENCOUNTER — Other Ambulatory Visit: Payer: BLUE CROSS/BLUE SHIELD | Admitting: Internal Medicine

## 2015-07-31 DIAGNOSIS — E669 Obesity, unspecified: Secondary | ICD-10-CM

## 2015-07-31 DIAGNOSIS — Z0000 Encounter for general adult medical examination without abnormal findings: Secondary | ICD-10-CM

## 2015-07-31 DIAGNOSIS — Z125 Encounter for screening for malignant neoplasm of prostate: Secondary | ICD-10-CM

## 2015-07-31 DIAGNOSIS — I10 Essential (primary) hypertension: Secondary | ICD-10-CM

## 2015-07-31 DIAGNOSIS — E8881 Metabolic syndrome: Secondary | ICD-10-CM

## 2015-07-31 DIAGNOSIS — Z79899 Other long term (current) drug therapy: Secondary | ICD-10-CM

## 2015-07-31 DIAGNOSIS — E119 Type 2 diabetes mellitus without complications: Secondary | ICD-10-CM

## 2015-07-31 LAB — COMPLETE METABOLIC PANEL WITH GFR
ALT: 25 U/L (ref 9–46)
AST: 20 U/L (ref 10–35)
Albumin: 4.1 g/dL (ref 3.6–5.1)
Alkaline Phosphatase: 22 U/L — ABNORMAL LOW (ref 40–115)
BUN: 15 mg/dL (ref 7–25)
CO2: 27 mmol/L (ref 20–31)
Calcium: 9.6 mg/dL (ref 8.6–10.3)
Chloride: 101 mmol/L (ref 98–110)
Creat: 1 mg/dL (ref 0.70–1.25)
GFR, Est African American: >89 mL/min (ref >=60)
GFR, Est Non African American: 80 mL/min (ref >=60)
Glucose, Bld: 146 mg/dL — ABNORMAL HIGH (ref 65–99)
Potassium: 5 mmol/L (ref 3.5–5.3)
Sodium: 138 mmol/L (ref 135–146)
Total Bilirubin: 0.5 mg/dL (ref 0.2–1.2)
Total Protein: 6.1 g/dL (ref 6.1–8.1)

## 2015-07-31 LAB — LIPID PANEL
Cholesterol: 113 mg/dL — ABNORMAL LOW (ref 125–200)
HDL: 30 mg/dL — ABNORMAL LOW (ref >=40)
LDL Cholesterol: 56 mg/dL (ref <130)
Total CHOL/HDL Ratio: 3.8 Ratio (ref <=5.0)
Triglycerides: 134 mg/dL (ref <150)
VLDL: 27 mg/dL (ref <30)

## 2015-07-31 LAB — CBC WITH DIFFERENTIAL/PLATELET
Basophils Absolute: 0.1 10*3/uL (ref 0.0–0.1)
Basophils Relative: 1 % (ref 0–1)
Eosinophils Absolute: 0.1 10*3/uL (ref 0.0–0.7)
Eosinophils Relative: 2 % (ref 0–5)
HCT: 42.4 % (ref 39.0–52.0)
Hemoglobin: 14.6 g/dL (ref 13.0–17.0)
Lymphocytes Relative: 37 % (ref 12–46)
Lymphs Abs: 2.1 10*3/uL (ref 0.7–4.0)
MCH: 30.9 pg (ref 26.0–34.0)
MCHC: 34.4 g/dL (ref 30.0–36.0)
MCV: 89.6 fL (ref 78.0–100.0)
MPV: 10.5 fL (ref 8.6–12.4)
Monocytes Absolute: 0.5 10*3/uL (ref 0.1–1.0)
Monocytes Relative: 9 % (ref 3–12)
Neutro Abs: 2.9 10*3/uL (ref 1.7–7.7)
Neutrophils Relative %: 51 % (ref 43–77)
Platelets: 197 10*3/uL (ref 150–400)
RBC: 4.73 MIL/uL (ref 4.22–5.81)
RDW: 13.5 % (ref 11.5–15.5)
WBC: 5.7 10*3/uL (ref 4.0–10.5)

## 2015-07-31 LAB — HEMOGLOBIN A1C
Hgb A1c MFr Bld: 7.5 % — ABNORMAL HIGH (ref <5.7)
Mean Plasma Glucose: 169 mg/dL — ABNORMAL HIGH (ref <117)

## 2015-08-01 ENCOUNTER — Ambulatory Visit (INDEPENDENT_AMBULATORY_CARE_PROVIDER_SITE_OTHER): Payer: BLUE CROSS/BLUE SHIELD | Admitting: Internal Medicine

## 2015-08-01 ENCOUNTER — Encounter: Payer: Self-pay | Admitting: Internal Medicine

## 2015-08-01 VITALS — BP 138/80 | HR 76 | Temp 98.4°F | Resp 20 | Ht 74.0 in | Wt 259.0 lb

## 2015-08-01 DIAGNOSIS — G609 Hereditary and idiopathic neuropathy, unspecified: Secondary | ICD-10-CM

## 2015-08-01 DIAGNOSIS — E8881 Metabolic syndrome: Secondary | ICD-10-CM | POA: Diagnosis not present

## 2015-08-01 DIAGNOSIS — E669 Obesity, unspecified: Secondary | ICD-10-CM | POA: Diagnosis not present

## 2015-08-01 DIAGNOSIS — N529 Male erectile dysfunction, unspecified: Secondary | ICD-10-CM

## 2015-08-01 DIAGNOSIS — Z8739 Personal history of other diseases of the musculoskeletal system and connective tissue: Secondary | ICD-10-CM

## 2015-08-01 DIAGNOSIS — I10 Essential (primary) hypertension: Secondary | ICD-10-CM | POA: Diagnosis not present

## 2015-08-01 DIAGNOSIS — Z8639 Personal history of other endocrine, nutritional and metabolic disease: Secondary | ICD-10-CM

## 2015-08-01 DIAGNOSIS — E785 Hyperlipidemia, unspecified: Secondary | ICD-10-CM

## 2015-08-01 DIAGNOSIS — E786 Lipoprotein deficiency: Secondary | ICD-10-CM

## 2015-08-01 DIAGNOSIS — M542 Cervicalgia: Secondary | ICD-10-CM

## 2015-08-01 DIAGNOSIS — E119 Type 2 diabetes mellitus without complications: Secondary | ICD-10-CM | POA: Diagnosis not present

## 2015-08-01 DIAGNOSIS — Z0000 Encounter for general adult medical examination without abnormal findings: Secondary | ICD-10-CM

## 2015-08-01 DIAGNOSIS — Z8659 Personal history of other mental and behavioral disorders: Secondary | ICD-10-CM | POA: Diagnosis not present

## 2015-08-01 LAB — POCT URINALYSIS DIPSTICK
Bilirubin, UA: NEGATIVE
Blood, UA: NEGATIVE
Glucose, UA: 500
Ketones, UA: NEGATIVE
Leukocytes, UA: NEGATIVE
Nitrite, UA: NEGATIVE
Protein, UA: NEGATIVE
Spec Grav, UA: 1.025
Urobilinogen, UA: 0.2
pH, UA: 7.5

## 2015-08-01 LAB — PSA: PSA: 2.63 ng/mL (ref <=4.00)

## 2015-08-01 MED ORDER — CYCLOBENZAPRINE HCL 10 MG PO TABS: 10.0000 mg | ORAL_TABLET | Freq: Every evening | ORAL | Status: DC | PRN

## 2015-08-01 NOTE — Patient Instructions (Addendum)
Watch diet. Return in 6 months. Continue same medications. Try to lose weight. Try to get exercise. Take Flexeril as needed for neck pain.

## 2015-08-02 ENCOUNTER — Encounter: Payer: Self-pay | Admitting: Internal Medicine

## 2015-08-02 NOTE — Progress Notes (Signed)
Subjective:    Patient ID: [name] [name], male    DOB: 09/15/1950, 65 y.o.   MRN: [id number]  HPI 65 year old White Male in today for health maintenance exam and evaluation of medical issues. Patient has a history of essential hypertension, hyperlipidemia, obesity, controlled type 2 diabetes mellitus, metabolic syndrome. History of depression, erectile dysfunction, gout. History of back pain status post L2-L3 vasectomy March 2014 for right lower extremity radiculopathy and back pain. History of BPH treated with Flomax.  New complaint today is right neck pain extending into right shoulder and upper arm. No radiculopathy. May try Flexeril for pain.  Past medical history: Colonoscopy 2010 with history of adenomatous colon polyps. Cardiolite study 2004 which was negative. Pneumovax immunization 2010. I physician is in [facility]. Reminded about annual diabetic eye exam.  Dr. Daylene Katayama saw him in 2007 and he was felt to have degenerative arthritis in the acromioclavicular joint of the right shoulder. In March 1999 he had a partial tear of his gastroc muscle in left leg treated by Dr. Sharol Given. Patient says he had cyst removed from his spine in 1977 that may actually have been a pilonidal cyst.  History of lumbar disc at L4-L5 and L5-S1 in 1998. Presented with left lower extremity radiculopathy at that time. He received epidural steroids 3 with improvement.  He has some numbness in his feet but no difficulty ambulating.  History of depression treated with Wellbutrin.  Social history: He is married. Completed one year college. Enjoys golf. Does not smoke. Drinks beer. 2 adult children, son and a daughter. He is a self-employed Hydrologist.  Tetanus immunization update given 2014.  Family history: Mother died of stomach cancer at age 24. Father died of lung cancer at age 45. They apparently died within 6 months of each other when he was 7 or 68 years old. 3 sisters in good  health.      Review of Systems  Eyes: Negative.   Respiratory: Negative.   Cardiovascular: Negative.   Genitourinary: Negative.   Musculoskeletal:       Pain in right neck and right upper arm and shoulder  Psychiatric/Behavioral: Negative.        Objective:   Physical Exam  Constitutional: He is oriented to person, place, and time. He appears well-developed and well-nourished. No distress.  HENT:  Head: Normocephalic and atraumatic.  Right Ear: External ear normal.  Left Ear: External ear normal.  Mouth/Throat: No oropharyngeal exudate.  Eyes: Conjunctivae and EOM are normal. Pupils are equal, round, and reactive to light. Right eye exhibits no discharge. Left eye exhibits no discharge. No scleral icterus.  Neck: Normal range of motion. Neck supple. No JVD present.  Palpable spasm right neck  Cardiovascular: Normal rate, regular rhythm, normal heart sounds and intact distal pulses.   No murmur heard. Pulmonary/Chest: Effort normal and breath sounds normal. No respiratory distress. He has no wheezes. He has no rales. He exhibits no tenderness.  Abdominal: Soft. Bowel sounds are normal. He exhibits no distension and no mass. There is no tenderness. There is no rebound and no guarding.  Genitourinary: Prostate normal.  Musculoskeletal: Normal range of motion. He exhibits no edema.  Lymphadenopathy:    He has no cervical adenopathy.  Neurological: He is alert and oriented to person, place, and time. He has normal reflexes. No cranial nerve deficit. Coordination normal.  Skin: Skin is warm and dry. No rash noted. He is not diaphoretic.  Psychiatric: He has a normal mood and affect.  His behavior is normal. Judgment and thought content normal.  Vitals reviewed.         Assessment & Plan:  Right neck musculoskeletal pain-doubt cervical radiculopathy. May take Flexeril as needed.  Controlled type 2 diabetes mellitus-hemoglobin A1c is increased to 7.5% which is a bit high for  him. He needs to watch diet, try to get some exercise and recheck in 6 months  BPH-stable with Flomax  Essential hypertension-stable  Hyperlipidemia-within normal limits a lipid-lowering medication. HDL is low at 30.  Depression-stable  on Wellbutrin  Peripheral neuropathy secondary to diabetes-stable  History of adenomatous colon polyps  History of erectile dysfunction  History of gout treated with allopurinol  Plan: Continue same medications and return in 6 months. Labs due at that time will be lipid panel liver functions and hemoglobin A1c.

## 2015-08-07 ENCOUNTER — Other Ambulatory Visit: Payer: Self-pay | Admitting: Internal Medicine

## 2015-08-11 ENCOUNTER — Telehealth: Payer: Self-pay

## 2015-08-11 MED ORDER — SAXAGLIPTIN HCL 5 MG PO TABS: 5.0000 mg | ORAL_TABLET | Freq: Every day | ORAL | Status: DC

## 2015-08-11 NOTE — Telephone Encounter (Signed)
Please order  Onglyza 5 mg daily and  I will see him in 6 weeks with OV and AIC. Explain this to him please.

## 2015-08-11 NOTE — Telephone Encounter (Signed)
Medication sent to pharmacy  

## 2015-08-11 NOTE — Telephone Encounter (Signed)
Prior authorization of [name] [name] was obtained through patients insurance for coverage. It was denied stating that patient must try and fail Onglyza first. Please advise.

## 2015-08-14 NOTE — Telephone Encounter (Signed)
LM for return call from patient.

## 2015-08-21 NOTE — Telephone Encounter (Signed)
Patient returned the call; states to always call his cell # to get him.  No one calls his home # except pharmacy and step-daughter.  Advised of Onglyza info and provided appointment for 3/20 for A1C and 3/21 @ 9:45.  Patient picked up med on 1/30 and started taking the medication.  Patient verbalized understanding of the instructions.  He'll also be here on 2/8 for f/u of BP.

## 2015-08-23 ENCOUNTER — Ambulatory Visit (INDEPENDENT_AMBULATORY_CARE_PROVIDER_SITE_OTHER): Payer: BLUE CROSS/BLUE SHIELD | Admitting: Internal Medicine

## 2015-08-23 ENCOUNTER — Encounter: Payer: Self-pay | Admitting: Internal Medicine

## 2015-08-23 VITALS — BP 152/82 | HR 76 | Temp 97.9°F | Resp 20 | Wt 258.0 lb

## 2015-08-23 DIAGNOSIS — I10 Essential (primary) hypertension: Secondary | ICD-10-CM

## 2015-08-23 MED ORDER — AMLODIPINE BESYLATE 5 MG PO TABS: 5.0000 mg | ORAL_TABLET | Freq: Every day | ORAL | Status: DC

## 2015-08-23 NOTE — Progress Notes (Signed)
   Subjective:    Patient ID: MONA THOMMEN, male    DOB: [birth year], 65 y.o.   MRN: [id number]  HPI Is here today to follow-up on elevated blood pressure at last visit. Blood pressure remains elevated. He's been on Ramapril 10 mg daily for a number of years. I'm going to add amlodipine 5 mg daily and have him return in 3 weeks. His insurance plan would not cover Trajenta so  he is now on Onglyza for controlled type 2 diabetes mellitus.    Review of Systems     Objective:   Physical Exam   Not examined. Spent 5 minutes speaking with him about these issues     Assessment & Plan:  Essential hypertension-add amlodipine 5 mg daily to Ramapril 10 mg daily and return in 3 weeks  Controlled type 2 diabetes mellitus-now on Onglyza instead of Trajenta due to insurance plan. Will need follow-up on Onglyza later

## 2015-08-23 NOTE — Patient Instructions (Signed)
Add amlodipine to Ramapril and return in 3 weeks.

## 2015-09-15 ENCOUNTER — Encounter: Payer: Self-pay | Admitting: Internal Medicine

## 2015-09-15 ENCOUNTER — Ambulatory Visit: Payer: BLUE CROSS/BLUE SHIELD | Admitting: Internal Medicine

## 2015-09-17 ENCOUNTER — Other Ambulatory Visit: Payer: Self-pay | Admitting: Internal Medicine

## 2015-09-29 ENCOUNTER — Ambulatory Visit (INDEPENDENT_AMBULATORY_CARE_PROVIDER_SITE_OTHER): Payer: BLUE CROSS/BLUE SHIELD | Admitting: Internal Medicine

## 2015-09-29 ENCOUNTER — Encounter: Payer: Self-pay | Admitting: Internal Medicine

## 2015-09-29 VITALS — BP 140/80

## 2015-09-29 DIAGNOSIS — I10 Essential (primary) hypertension: Secondary | ICD-10-CM

## 2015-09-29 NOTE — Progress Notes (Signed)
   Subjective:    Patient ID: LEHI CORNS, male    DOB: [birth year], 65 y.o.   MRN: [id number]  HPI Here for blood pressure check. Last visit blood pressure was 152/82. 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 Says he's been a little more anxious than usual but probably due to some business pressures.    Review of Systems see above     Objective:   Physical Exam  BP 140/80 left arm regular cuff      Assessment & Plan:  Essential hypertension  Obesity  Metabolic syndrome  Controlled type 2 diabetes mellitus  History of anxiety  Hyperlipidemia  Plan: Patient will return for six-month follow-up in July. Had physical exam January 2017. Continue same medications.

## 2015-09-29 NOTE — Patient Instructions (Signed)
Continue same medications and return in July 

## 2015-10-15 ENCOUNTER — Other Ambulatory Visit: Payer: Self-pay | Admitting: Internal Medicine

## 2015-10-29 ENCOUNTER — Other Ambulatory Visit: Payer: Self-pay | Admitting: Internal Medicine

## 2015-11-12 ENCOUNTER — Other Ambulatory Visit: Payer: Self-pay | Admitting: Internal Medicine

## 2015-11-26 ENCOUNTER — Other Ambulatory Visit: Payer: Self-pay | Admitting: Internal Medicine

## 2015-12-13 ENCOUNTER — Other Ambulatory Visit: Payer: Self-pay | Admitting: Internal Medicine

## 2016-01-30 ENCOUNTER — Other Ambulatory Visit: Payer: BLUE CROSS/BLUE SHIELD | Admitting: Internal Medicine

## 2016-02-01 ENCOUNTER — Ambulatory Visit: Payer: BLUE CROSS/BLUE SHIELD | Admitting: Internal Medicine

## 2016-02-05 NOTE — Progress Notes (Signed)
Erroneous encounter

## 2016-02-07 NOTE — Progress Notes (Signed)
Erroneous encounter

## 2016-02-08 ENCOUNTER — Other Ambulatory Visit: Payer: Self-pay | Admitting: Internal Medicine

## 2016-02-09 ENCOUNTER — Other Ambulatory Visit: Payer: BLUE CROSS/BLUE SHIELD | Admitting: Internal Medicine

## 2016-02-09 DIAGNOSIS — I10 Essential (primary) hypertension: Secondary | ICD-10-CM

## 2016-02-09 DIAGNOSIS — E8881 Metabolic syndrome: Secondary | ICD-10-CM

## 2016-02-09 DIAGNOSIS — E118 Type 2 diabetes mellitus with unspecified complications: Secondary | ICD-10-CM

## 2016-02-09 LAB — HEPATIC FUNCTION PANEL
ALT: 28 U/L (ref 9–46)
AST: 23 U/L (ref 10–35)
Albumin: 4.3 g/dL (ref 3.6–5.1)
Alkaline Phosphatase: 24 U/L — ABNORMAL LOW (ref 40–115)
Bilirubin, Direct: 0.2 mg/dL (ref <=0.2)
Indirect Bilirubin: 0.5 mg/dL (ref 0.2–1.2)
Total Bilirubin: 0.7 mg/dL (ref 0.2–1.2)
Total Protein: 6.5 g/dL (ref 6.1–8.1)

## 2016-02-09 LAB — LIPID PANEL
Cholesterol: 181 mg/dL (ref 125–200)
HDL: 31 mg/dL — ABNORMAL LOW (ref >=40)
LDL Cholesterol: 106 mg/dL (ref <130)
Total CHOL/HDL Ratio: 5.8 Ratio — ABNORMAL HIGH (ref <=5.0)
Triglycerides: 220 mg/dL — ABNORMAL HIGH (ref <150)
VLDL: 44 mg/dL — ABNORMAL HIGH (ref <30)

## 2016-02-09 LAB — HEMOGLOBIN A1C
Hgb A1c MFr Bld: 7.6 % — ABNORMAL HIGH (ref <5.7)
Mean Plasma Glucose: 171 mg/dL

## 2016-02-13 ENCOUNTER — Ambulatory Visit (INDEPENDENT_AMBULATORY_CARE_PROVIDER_SITE_OTHER): Payer: BLUE CROSS/BLUE SHIELD | Admitting: Internal Medicine

## 2016-02-13 ENCOUNTER — Telehealth: Payer: Self-pay

## 2016-02-13 ENCOUNTER — Encounter: Payer: Self-pay | Admitting: Internal Medicine

## 2016-02-13 VITALS — BP 158/94 | HR 76 | Temp 98.0°F | Ht 74.0 in | Wt 257.0 lb

## 2016-02-13 DIAGNOSIS — E8881 Metabolic syndrome: Secondary | ICD-10-CM

## 2016-02-13 DIAGNOSIS — E785 Hyperlipidemia, unspecified: Secondary | ICD-10-CM | POA: Diagnosis not present

## 2016-02-13 DIAGNOSIS — E669 Obesity, unspecified: Secondary | ICD-10-CM

## 2016-02-13 DIAGNOSIS — E119 Type 2 diabetes mellitus without complications: Secondary | ICD-10-CM

## 2016-02-13 DIAGNOSIS — I10 Essential (primary) hypertension: Secondary | ICD-10-CM

## 2016-02-13 MED ORDER — LOSARTAN POTASSIUM 100 MG PO TABS: 100.0000 mg | ORAL_TABLET | Freq: Every day | ORAL | 1 refills | Status: DC

## 2016-02-13 MED ORDER — INSULIN GLARGINE 100 UNIT/ML ~~LOC~~ SOLN: 20.0000 [IU] | Freq: Every day | SUBCUTANEOUS | 11 refills | Status: DC

## 2016-02-13 NOTE — Telephone Encounter (Signed)
Called patient informed ramipril changed to losartan due to increased b/p, will f/u in the office in 4 weeks per Dr. Renold Genta. Patient verbalized understanding.

## 2016-02-13 NOTE — Patient Instructions (Addendum)
I'm going to change Ramapril to losartan 100 mg daily. Return in 4 weeks for office visit. Increase Lantus to 20 units at bedtime. I want a.m. blood sugar to be around 120 not 175. Check on out-of-pocket expense for Onglyza. Watch diet.

## 2016-02-13 NOTE — Progress Notes (Signed)
   Subjective:    Patient ID: [name] [name], male    DOB: 12/29/1950, 65 y.o.   MRN: [id number]  HPI He is here today for six-month 3 check on hyperlipidemia, essential hypertension, obesity, metabolic syndrome, diabetes mellitus. His hemoglobin A1c is 7.6%. He says he doesn't feel as well as he did last year. He doesn't think he is felt as well since he was changed from [place] to [place]. He is on Lantus 18 units at bedtime. I'm going to increase that to 20 units at bedtime. I would like for his a.m. glucose to be around 120. It's been more like 175 recently. His blood pressures also elevated. He's been on [place] for a long time and is also on low-dose amlodipine. [place] change him to losartan 100 mg daily and have him return in 4 weeks. He'll need office visit blood pressure check and basic metabolic panel at that time.  Not watching his diet is much she should. Triglycerides are elevated at 220 and previously were 134. Liver functions are stable. He is on maximum dose of Lipitor and also on fenofibrate.   Review of Systems see above     Objective:   Physical Exam Skin warm and dry. Nodes none. Neck supple without JVD thyromegaly or carotid bruits. Chest clear. Cardiac exam regular rate and rhythm. Normal S1 and S2.       Assessment & Plan:  Controlled diabetes mellitus. Would like to see control better than 7.6% increase Lantus to 20 units at bedtime. He may need to pay out of pocket for Onglyza. He plans to go on Medicare in April but may be going bare without insurance for 4 months from January until April.  Obesity  Metabolic syndrome  Essential hypertension-blood pressure remains elevated. Discontinue Ramapril. Losartan 100 mg daily and follow-up in 4 weeks with office visit and basic metabolic panel.  Hyperlipidemia-stable on current regimen but triglycerides have increased recently. Needs to watch diet.  Plan: Return in 4 weeks

## 2016-03-14 ENCOUNTER — Ambulatory Visit (INDEPENDENT_AMBULATORY_CARE_PROVIDER_SITE_OTHER): Payer: BLUE CROSS/BLUE SHIELD | Admitting: Internal Medicine

## 2016-03-14 ENCOUNTER — Encounter: Payer: Self-pay | Admitting: Internal Medicine

## 2016-03-14 VITALS — BP 146/90 | HR 82 | Temp 97.9°F | Ht 74.0 in | Wt 263.0 lb

## 2016-03-14 DIAGNOSIS — E119 Type 2 diabetes mellitus without complications: Secondary | ICD-10-CM

## 2016-03-14 DIAGNOSIS — E8881 Metabolic syndrome: Secondary | ICD-10-CM

## 2016-03-14 DIAGNOSIS — I10 Essential (primary) hypertension: Secondary | ICD-10-CM | POA: Diagnosis not present

## 2016-03-14 MED ORDER — LOSARTAN POTASSIUM 100 MG PO TABS
100.0000 mg | ORAL_TABLET | Freq: Every day | ORAL | 1 refills | Status: DC
Start: 2016-03-14 — End: 2016-06-13

## 2016-03-14 MED ORDER — AMLODIPINE BESYLATE 5 MG PO TABS: ORAL_TABLET | ORAL | 5 refills | Status: DC

## 2016-03-14 NOTE — Patient Instructions (Signed)
Increase Lantus to 25 units at bedtime. Trying to get Accu-Cheks to the closer to 120. They have been running in the 160 range. Continue losartan 100 mg daily. Restart amlodipine 5 mg daily. Return in 3 weeks

## 2016-03-14 NOTE — Progress Notes (Signed)
   Subjective:    Patient ID: [name] [name], male    DOB: 1950-10-08, 65 y.o.   MRN: [id number]  HPI Here today to follow-up on type 2 diabetes mellitus which needed some improvement and essential hypertension which also needed to be under better control. Have discovered that he's not been taking amlodipine. He is on losartan 100 mg daily. We changed him from [place] real to losartan. Amlodipine was prescribed for him in April.    Review of Systems see above     Objective:   Physical Exam  Blood pressure repeated 140/84 left arm      Assessment & Plan:  Essential hypertension  Obesity  Type 2 diabetes mellitus  Plan: Increase Lantus to 25 units at bedtime. He has not been taking amlodipine as we thought. Restart amlodipine. Continue losartan 100 mg daily. We may need to add HCTZ to that. He will return in 3 weeks. Needs to diet and exercise. He is not walking like he used to.

## 2016-04-04 ENCOUNTER — Ambulatory Visit (INDEPENDENT_AMBULATORY_CARE_PROVIDER_SITE_OTHER): Payer: BLUE CROSS/BLUE SHIELD | Admitting: Internal Medicine

## 2016-04-04 ENCOUNTER — Encounter: Payer: Self-pay | Admitting: Internal Medicine

## 2016-04-04 VITALS — BP 138/82 | HR 79 | Temp 97.9°F | Wt 263.5 lb

## 2016-04-04 DIAGNOSIS — Z23 Encounter for immunization: Secondary | ICD-10-CM | POA: Diagnosis not present

## 2016-04-04 DIAGNOSIS — E119 Type 2 diabetes mellitus without complications: Secondary | ICD-10-CM | POA: Diagnosis not present

## 2016-04-04 DIAGNOSIS — E785 Hyperlipidemia, unspecified: Secondary | ICD-10-CM | POA: Diagnosis not present

## 2016-04-04 DIAGNOSIS — E8881 Metabolic syndrome: Secondary | ICD-10-CM | POA: Diagnosis not present

## 2016-04-04 DIAGNOSIS — I10 Essential (primary) hypertension: Secondary | ICD-10-CM | POA: Diagnosis not present

## 2016-04-04 DIAGNOSIS — E669 Obesity, unspecified: Secondary | ICD-10-CM

## 2016-04-04 NOTE — Progress Notes (Signed)
   Subjective:    Patient ID: [name] [name], male    DOB: 1951-03-19, 65 y.o.   MRN: [id number]  HPI he's here today for follow-up on hypertension. He's not sure if he's taking losartan 100 mg daily or not. He took a snapshot of the medication list. He's to call me back later today and let me know. He did pick up amlodipine 5 mg daily. Blood pressure is elevated in the left arm 150/80 and in the right arm 130/80.  He's drinking about 5 Coors Light beers daily. His wife told me this when she was here recently. Talked with him today about alcohol consumption.  Says Accu-Cheks at been acceptable. Recently increased Lantus to 25 units daily at bedtime.  Hemoglobin A1c in July was 7.6%.  He has lab appointment in December. He goes on Medicare in the Spring. He plans to go without insurance for a few months after the first of the year.    Review of Systems see above     Objective:   Physical Exam  Skin warm and dry. Nodes none. Chest clear. Cardiac exam regular rate and rhythm. Extremities without edema.      Assessment & Plan:  Essential hypertension  Controlled type 2 diabetes mellitus  Hyperlipidemia  Metabolic syndrome  Obesity  Plan: Limit beer consumption to no more than 2 daily. Stay on same dose of Lantus. He is to check blood pressure medications and give me a call and I will advise him further. Flu vaccine given today. Prescription given for home blood pressure monitor

## 2016-04-04 NOTE — Patient Instructions (Addendum)
Please check your blood pressure medications and give [place] call with what you are taking. Her other instructions to follow. Flu vaccine given today. Prescription given for home blood pressure monitor

## 2016-04-21 ENCOUNTER — Other Ambulatory Visit: Payer: Self-pay | Admitting: Internal Medicine

## 2016-05-22 ENCOUNTER — Other Ambulatory Visit: Payer: Self-pay | Admitting: Internal Medicine

## 2016-06-07 ENCOUNTER — Other Ambulatory Visit: Payer: Self-pay | Admitting: Internal Medicine

## 2016-06-13 ENCOUNTER — Other Ambulatory Visit: Payer: Self-pay | Admitting: Internal Medicine

## 2016-06-13 NOTE — Telephone Encounter (Signed)
RF x 90 days

## 2016-07-02 ENCOUNTER — Other Ambulatory Visit: Payer: BLUE CROSS/BLUE SHIELD | Admitting: Internal Medicine

## 2016-07-02 DIAGNOSIS — E118 Type 2 diabetes mellitus with unspecified complications: Secondary | ICD-10-CM

## 2016-07-02 DIAGNOSIS — E785 Hyperlipidemia, unspecified: Secondary | ICD-10-CM

## 2016-07-02 DIAGNOSIS — I10 Essential (primary) hypertension: Secondary | ICD-10-CM

## 2016-07-02 LAB — HEPATIC FUNCTION PANEL
ALT: 34 U/L (ref 9–46)
AST: 27 U/L (ref 10–35)
Albumin: 4.3 g/dL (ref 3.6–5.1)
Alkaline Phosphatase: 23 U/L — ABNORMAL LOW (ref 40–115)
Bilirubin, Direct: 0.1 mg/dL (ref <=0.2)
Indirect Bilirubin: 0.6 mg/dL (ref 0.2–1.2)
Total Bilirubin: 0.7 mg/dL (ref 0.2–1.2)
Total Protein: 6.4 g/dL (ref 6.1–8.1)

## 2016-07-02 LAB — LIPID PANEL
Cholesterol: 179 mg/dL (ref <200)
HDL: 29 mg/dL — ABNORMAL LOW (ref >40)
LDL Cholesterol: 111 mg/dL — ABNORMAL HIGH (ref <100)
Total CHOL/HDL Ratio: 6.2 Ratio — ABNORMAL HIGH (ref <5.0)
Triglycerides: 195 mg/dL — ABNORMAL HIGH (ref <150)
VLDL: 39 mg/dL — ABNORMAL HIGH (ref <30)

## 2016-07-02 LAB — HEMOGLOBIN A1C
Hgb A1c MFr Bld: 7.8 % — ABNORMAL HIGH (ref <5.7)
Mean Plasma Glucose: 177 mg/dL

## 2016-07-03 LAB — MICROALBUMIN, URINE: Microalb, Ur: 2.3 mg/dL

## 2016-07-04 ENCOUNTER — Telehealth: Payer: Self-pay | Admitting: Internal Medicine

## 2016-07-04 NOTE — Telephone Encounter (Signed)
-----   Message from Elby Showers, MD sent at 07/04/2016 11:20 AM EST ----- Hgb AIC slightly worse as is triglycerides. Really needs diet and exercise. Does he want to see dietician? Increase Lantus to 30 units HS and follow up in April. Is BP doing OK?

## 2016-07-04 NOTE — Telephone Encounter (Signed)
Patient aware of lab results.  Declines dietician.  Patient advised to increase lantus to 30 units hs.  He will call back to schedule appointment.

## 2016-07-13 ENCOUNTER — Other Ambulatory Visit: Payer: Self-pay | Admitting: Internal Medicine

## 2016-07-16 ENCOUNTER — Other Ambulatory Visit: Payer: Self-pay | Admitting: Internal Medicine

## 2016-08-18 ENCOUNTER — Other Ambulatory Visit: Payer: Self-pay | Admitting: Internal Medicine

## 2016-08-31 ENCOUNTER — Encounter: Payer: Self-pay | Admitting: Internal Medicine

## 2016-09-15 ENCOUNTER — Other Ambulatory Visit: Payer: Self-pay | Admitting: Internal Medicine

## 2016-10-14 ENCOUNTER — Other Ambulatory Visit: Payer: Self-pay | Admitting: Internal Medicine

## 2016-10-14 NOTE — Telephone Encounter (Signed)
Verbal order by Dr. Renold Genta to refill Wellbutril XL 300 mg #90 1 refill.  Metformin 1000mg  #180  1 refill.  Called to [facility] @ [phone].

## 2016-10-23 ENCOUNTER — Other Ambulatory Visit: Payer: Self-pay | Admitting: Internal Medicine

## 2016-10-24 ENCOUNTER — Other Ambulatory Visit: Payer: PPO | Admitting: Internal Medicine

## 2016-10-24 DIAGNOSIS — Z0000 Encounter for general adult medical examination without abnormal findings: Secondary | ICD-10-CM

## 2016-10-24 DIAGNOSIS — N529 Male erectile dysfunction, unspecified: Secondary | ICD-10-CM | POA: Diagnosis not present

## 2016-10-24 DIAGNOSIS — E8881 Metabolic syndrome: Secondary | ICD-10-CM | POA: Diagnosis not present

## 2016-10-24 DIAGNOSIS — Z125 Encounter for screening for malignant neoplasm of prostate: Secondary | ICD-10-CM | POA: Diagnosis not present

## 2016-10-24 DIAGNOSIS — E785 Hyperlipidemia, unspecified: Secondary | ICD-10-CM

## 2016-10-24 DIAGNOSIS — Z8739 Personal history of other diseases of the musculoskeletal system and connective tissue: Secondary | ICD-10-CM | POA: Diagnosis not present

## 2016-10-24 DIAGNOSIS — E119 Type 2 diabetes mellitus without complications: Secondary | ICD-10-CM | POA: Diagnosis not present

## 2016-10-24 LAB — LIPID PANEL
Cholesterol: 177 mg/dL (ref <200)
HDL: 33 mg/dL — ABNORMAL LOW (ref >40)
LDL Cholesterol: 118 mg/dL — ABNORMAL HIGH (ref <100)
Total CHOL/HDL Ratio: 5.4 Ratio — ABNORMAL HIGH (ref <5.0)
Triglycerides: 132 mg/dL (ref <150)
VLDL: 26 mg/dL (ref <30)

## 2016-10-24 LAB — CBC WITH DIFFERENTIAL/PLATELET
Basophils Absolute: 53 cells/uL (ref 0–200)
Basophils Relative: 1 %
Eosinophils Absolute: 106 cells/uL (ref 15–500)
Eosinophils Relative: 2 %
HCT: 41.8 % (ref 38.5–50.0)
Hemoglobin: 13.8 g/dL (ref 13.2–17.1)
Lymphocytes Relative: 32 %
Lymphs Abs: 1696 cells/uL (ref 850–3900)
MCH: 30.1 pg (ref 27.0–33.0)
MCHC: 33 g/dL (ref 32.0–36.0)
MCV: 91.1 fL (ref 80.0–100.0)
MPV: 10.1 fL (ref 7.5–12.5)
Monocytes Absolute: 424 cells/uL (ref 200–950)
Monocytes Relative: 8 %
Neutro Abs: 3021 cells/uL (ref 1500–7800)
Neutrophils Relative %: 57 %
Platelets: 218 10*3/uL (ref 140–400)
RBC: 4.59 MIL/uL (ref 4.20–5.80)
RDW: 13.6 % (ref 11.0–15.0)
WBC: 5.3 10*3/uL (ref 3.8–10.8)

## 2016-10-24 LAB — COMPREHENSIVE METABOLIC PANEL
ALT: 28 U/L (ref 9–46)
AST: 26 U/L (ref 10–35)
Albumin: 4 g/dL (ref 3.6–5.1)
Alkaline Phosphatase: 26 U/L — ABNORMAL LOW (ref 40–115)
BUN: 11 mg/dL (ref 7–25)
CO2: 28 mmol/L (ref 20–31)
Calcium: 9.6 mg/dL (ref 8.6–10.3)
Chloride: 102 mmol/L (ref 98–110)
Creat: 0.97 mg/dL (ref 0.70–1.25)
Glucose, Bld: 147 mg/dL — ABNORMAL HIGH (ref 65–99)
Potassium: 4.8 mmol/L (ref 3.5–5.3)
Sodium: 137 mmol/L (ref 135–146)
Total Bilirubin: 0.5 mg/dL (ref 0.2–1.2)
Total Protein: 6.3 g/dL (ref 6.1–8.1)

## 2016-10-25 LAB — MICROALBUMIN / CREATININE URINE RATIO
Creatinine, Urine: 160 mg/dL (ref 20–370)
Microalb Creat Ratio: 19 mcg/mg creat (ref <30)
Microalb, Ur: 3 mg/dL

## 2016-10-25 LAB — HEMOGLOBIN A1C
Hgb A1c MFr Bld: 6.8 % — ABNORMAL HIGH (ref <5.7)
Mean Plasma Glucose: 148 mg/dL

## 2016-10-25 LAB — PSA: PSA: 3.5 ng/mL (ref <=4.0)

## 2016-10-31 ENCOUNTER — Encounter: Payer: Self-pay | Admitting: Internal Medicine

## 2016-10-31 ENCOUNTER — Ambulatory Visit (INDEPENDENT_AMBULATORY_CARE_PROVIDER_SITE_OTHER): Payer: PPO | Admitting: Internal Medicine

## 2016-10-31 VITALS — BP 160/82 | HR 71 | Temp 97.0°F | Ht 72.0 in | Wt 263.0 lb

## 2016-10-31 DIAGNOSIS — E8881 Metabolic syndrome: Secondary | ICD-10-CM | POA: Diagnosis not present

## 2016-10-31 DIAGNOSIS — E784 Other hyperlipidemia: Secondary | ICD-10-CM

## 2016-10-31 DIAGNOSIS — Z23 Encounter for immunization: Secondary | ICD-10-CM

## 2016-10-31 DIAGNOSIS — I10 Essential (primary) hypertension: Secondary | ICD-10-CM

## 2016-10-31 DIAGNOSIS — Z0000 Encounter for general adult medical examination without abnormal findings: Secondary | ICD-10-CM

## 2016-10-31 DIAGNOSIS — E119 Type 2 diabetes mellitus without complications: Secondary | ICD-10-CM

## 2016-10-31 DIAGNOSIS — E786 Lipoprotein deficiency: Secondary | ICD-10-CM | POA: Diagnosis not present

## 2016-10-31 DIAGNOSIS — E7849 Other hyperlipidemia: Secondary | ICD-10-CM

## 2016-10-31 LAB — POCT URINALYSIS DIPSTICK
Bilirubin, UA: NEGATIVE
Blood, UA: NEGATIVE
Glucose, UA: NEGATIVE
Ketones, UA: NEGATIVE
Leukocytes, UA: NEGATIVE
Nitrite, UA: NEGATIVE
Protein, UA: NEGATIVE
Spec Grav, UA: 1.015 (ref 1.010–1.025)
Urobilinogen, UA: 0.2 E.U./dL
pH, UA: 6 (ref 5.0–8.0)

## 2016-10-31 MED ORDER — OLMESARTAN MEDOXOMIL 40 MG PO TABS: 40.0000 mg | ORAL_TABLET | Freq: Every day | ORAL | 1 refills | Status: DC

## 2016-10-31 MED ORDER — SITAGLIPTIN PHOSPHATE 100 MG PO TABS: 100.0000 mg | ORAL_TABLET | Freq: Every day | ORAL | 0 refills | Status: DC

## 2016-10-31 NOTE — Progress Notes (Signed)
Subjective:    Patient ID: [name] [name], male    DOB: [birth year], 66 y.o.   MRN: [id number]  HPI 66 year old White Male with history of diabetes mellitus, hypertension, hyperlipidemia,obesity, metabolic syndrome in today for welcome to Medicare physical examination and evaluation of medical issues. Blood pressure is elevated 160/82. Says it's been running high at home.  We are going to switch losartan to Benicar 40 mg daily. He has of several losartan tablets left. He'll return in 3 months for office visit and hemoglobin A1c.  He has had some issues with insurance coverage. For a while he was on Onglyza. Now says he needs to be on Januvia with this new insurance he has.  His [place] and A1c is excellent at the present time. He's been more active. Starting taking a B complex supplement and has more energy.  History of erectile dysfunction and gout.   History of back pain status post L2-L3 disectomy in March 2014 for right lower extremity radiculopathy and back pain.   History of BPH.  Past medical history: Colonoscopy 2010 with history of adenomatous colon polyps. Cardiolite study 2004 which was negative. Pneumovax immunization 2010. Prevnar 13 given today.  Eye physician is in [facility]. Reminded about annual diabetic eye exam.  Dr. Wess Botts saw him in 2007 and he was felt to have degenerative arthritis in his [facility] joint of the right shoulder. In March 1999 he had a partial tear of his gastroc muscle in the left leg treated by Dr. Sharol Given. Patient says he had a cyst removed from his spine in 1977 and may actually of had a pilonidal cyst.  History of lumbar disc at L4-L5 and L5-S1 in 1998. Presented with left lower sternum the radiculopathy at the time. He received epidural steroids 3 with improvement.  He has some mild numbness in his feet but no difficulty ambulating.  Remote history of depression treated with Wellbutrin and is stable and currently not depressed  Social history: He is  married. This is second marriage. First wife died of cancer. Completed one year of college. Enjoys golf. Does not smoke. Drinks beer. 2 adult children a son and a daughter. He is a self-employed Hydrologist.  Tetanus immunization update given 2014.  Family history: Mother died of stomach cancer at age 81. Father died of lung cancer at age 65. They apparently died within 6 months of each other when he was 58 or 31 years old. 3 sisters in good health.    Review of Systems  HENT: Negative.   Respiratory: Negative.   Cardiovascular: Negative.   Gastrointestinal: Negative.   Genitourinary: Negative.   Neurological: Negative.   Psychiatric/Behavioral: Negative.    no new complaints     Objective:   Physical Exam  Constitutional: He is oriented to person, place, and time. He appears well-developed and well-nourished. No distress.  HENT:  Head: Normocephalic and atraumatic.  Right Ear: External ear normal.  Left Ear: External ear normal.  Mouth/Throat: Oropharynx is clear and moist. No oropharyngeal exudate.  Eyes: Conjunctivae and EOM are normal. Pupils are equal, round, and reactive to light. Right eye exhibits no discharge. Left eye exhibits no discharge. No scleral icterus.  Neck: Neck supple. No JVD present. No thyromegaly present.  Cardiovascular: Normal rate, normal heart sounds and intact distal pulses.   No murmur heard. Pulmonary/Chest: Effort normal and breath sounds normal. No respiratory distress. He has no wheezes. He has no rales.  Abdominal: He exhibits no distension and no mass.  There is no tenderness. There is no rebound and no guarding.  Genitourinary: Prostate normal.  Musculoskeletal: He exhibits no edema.  Lymphadenopathy:    He has no cervical adenopathy.  Neurological: He is alert and oriented to person, place, and time. He has normal reflexes. No cranial nerve deficit. Coordination normal.  Skin: Skin is warm and dry. No rash noted. He is not  diaphoretic. No erythema.  Psychiatric: He has a normal mood and affect. His behavior is normal. Judgment and thought content normal.  Vitals reviewed.         Assessment & Plan:  Controlled type 2 diabetes mellitus. His hemoglobin A1c is excellent and has improved from last visit. Hemoglobin A1c is 6.8% and previously was 7.8%.  Hyperlipidemia-LDL is 118 but total cholesterol and triglycerides are normal  Obesity-needs to continue to work on diet exercise and weight loss  Essential hypertension just change losartan to Benicar 40 mg daily and follow-up in 3 months. He has about 60 losartan left and he doesn't want to change at present time.  Routine health maintenance-Prevnar 13 given today. EKG is within normal limits.  Subjective:   Patient presents for Medicare Annual/Subsequent preventive examination.  Review Past Medical/Family/Social:See above   Risk Factors  Current exercise habits: Working in yard more recently Dietary issues discussed: Low fat low carbohydrate  Cardiac risk factors:Hyperlipidemia and diabetes mellitus  Depression Screen  (Note: if answer to either of the following is "Yes", a more complete depression screening is indicated)   Over the past two weeks, have you felt down, depressed or hopeless? No  Over the past two weeks, have you felt little interest or pleasure in doing things? No Have you lost interest or pleasure in daily life? No Do you often feel hopeless? No Do you cry easily over simple problems? No   Activities of Daily Living  In your present state of health, do you have any difficulty performing the following activities?:   Driving? No  Managing money? No  Feeding yourself? No  Getting from bed to chair? No  Climbing a flight of stairs? No  Preparing food and eating?: No  Bathing or showering? No  Getting dressed: No  Getting to the toilet? No  Using the toilet:No  Moving around from place to place: No  In the past year have  you fallen or had a near fall?:No  Are you sexually active? No  Do you have more than one partner? No   Hearing Difficulties: No  Do you often ask people to speak up or repeat themselves? No  Do you experience ringing or noises in your ears? No  Do you have difficulty understanding soft or whispered voices? No  Do you feel that you have a problem with memory? No Do you often misplace items? No    Home Safety:  Do you have a smoke alarm at your residence? Yes Do you have grab bars in the bathroom?No Do you have throw rugs in your house? No   Cognitive Testing  Alert? Yes Normal Appearance?Yes  Oriented to person? Yes Place? Yes  Time? Yes  Recall of three objects? Yes  Can perform simple calculations? Yes  Displays appropriate judgment?Yes  Can read the correct time from a watch face?Yes   List the Names of Other Physician/Practitioners you currently use:  See referral list for the physicians patient is currently seeing.     Review of Systems: See above   Objective:     General appearance: Appears  stated age and mildly obese  Head: Normocephalic, without obvious abnormality, atraumatic  Eyes: conj clear, EOMi PEERLA  Ears: normal TM's and external ear canals both ears  Nose: Nares normal. Septum midline. Mucosa normal. No drainage or sinus tenderness.  Throat: lips, mucosa, and tongue normal; teeth and gums normal  Neck: no adenopathy, no carotid bruit, no JVD, supple, symmetrical, trachea midline and thyroid not enlarged, symmetric, no tenderness/mass/nodules  No CVA tenderness.  Lungs: clear to auscultation bilaterally  Breasts: normal appearance, no masses or tenderness Heart: regular rate and rhythm, S1, S2 normal, no murmur, click, rub or gallop  Abdomen: soft, non-tender; bowel sounds normal; no masses, no organomegaly  Musculoskeletal: ROM normal in all joints, no crepitus, no deformity, Normal muscle strengthen. Back  is symmetric, no curvature. Skin: Skin  color, texture, turgor normal. No rashes or lesions  Lymph nodes: Cervical, supraclavicular, and axillary nodes normal.  Neurologic: CN 2 -12 Normal, Normal symmetric reflexes. Normal coordination and gait  Psych: Alert & Oriented x 3, Mood appear stable.    Assessment:    Annual wellness medicare exam   Plan:    During the course of the visit the patient was educated and counseled about appropriate screening and preventive services including:   Prevnar 13 given today  Recommend annual flu vaccine     Patient Instructions (the written plan) was given to the patient.  Medicare Attestation  I have personally reviewed:  The patient's medical and social history  Their use of alcohol, tobacco or illicit drugs  Their current medications and supplements  The patient's functional ability including ADLs,fall risks, home safety risks, cognitive, and hearing and visual impairment  Diet and physical activities  Evidence for depression or mood disorders  The patient's weight, height, BMI, and visual acuity have been recorded in the chart. I have made referrals, counseling, and provided education to the patient based on review of the above and I have provided the patient with a written personalized care plan for preventive services.

## 2016-10-31 NOTE — Patient Instructions (Signed)
It was pleasure to see you today. Change losartan Benicar and return in 3 months.

## 2016-12-01 ENCOUNTER — Other Ambulatory Visit: Payer: Self-pay | Admitting: Internal Medicine

## 2016-12-02 MED ORDER — AMLODIPINE BESYLATE 5 MG PO TABS: 5.0000 mg | ORAL_TABLET | Freq: Every day | ORAL | 1 refills | Status: DC

## 2016-12-02 MED ORDER — METFORMIN HCL 1000 MG PO TABS: 1000.0000 mg | ORAL_TABLET | Freq: Two times a day (BID) | ORAL | 1 refills | Status: DC

## 2016-12-02 MED ORDER — SITAGLIPTIN PHOSPHATE 100 MG PO TABS: 100.0000 mg | ORAL_TABLET | Freq: Every day | ORAL | 1 refills | Status: DC

## 2016-12-02 NOTE — Telephone Encounter (Signed)
Refills done for 6 months as advised by Dr. Renold Genta.

## 2016-12-23 ENCOUNTER — Telehealth: Payer: Self-pay | Admitting: Internal Medicine

## 2016-12-23 MED ORDER — CYCLOBENZAPRINE HCL 10 MG PO TABS: 10.0000 mg | ORAL_TABLET | Freq: Every evening | ORAL | 11 refills | Status: AC | PRN

## 2016-12-23 NOTE — Addendum Note (Signed)
Addended by: Drucilla Schmidt on: 12/23/2016 11:41 AM   Modules accepted: Orders

## 2016-12-23 NOTE — Telephone Encounter (Signed)
Patient would like his Cycobenzaprine 10mg  medication refilled.

## 2016-12-23 NOTE — Telephone Encounter (Signed)
Refill for one year 

## 2016-12-23 NOTE — Telephone Encounter (Signed)
Sent for one year

## 2017-01-16 DIAGNOSIS — D225 Melanocytic nevi of trunk: Secondary | ICD-10-CM | POA: Diagnosis not present

## 2017-01-16 DIAGNOSIS — D2372 Other benign neoplasm of skin of left lower limb, including hip: Secondary | ICD-10-CM | POA: Diagnosis not present

## 2017-01-16 DIAGNOSIS — L570 Actinic keratosis: Secondary | ICD-10-CM | POA: Diagnosis not present

## 2017-01-16 DIAGNOSIS — L821 Other seborrheic keratosis: Secondary | ICD-10-CM | POA: Diagnosis not present

## 2017-01-16 DIAGNOSIS — Z85828 Personal history of other malignant neoplasm of skin: Secondary | ICD-10-CM | POA: Diagnosis not present

## 2017-01-31 ENCOUNTER — Ambulatory Visit (INDEPENDENT_AMBULATORY_CARE_PROVIDER_SITE_OTHER): Payer: PPO | Admitting: Internal Medicine

## 2017-01-31 ENCOUNTER — Encounter: Payer: Self-pay | Admitting: Internal Medicine

## 2017-01-31 VITALS — BP 166/80 | HR 77 | Ht 72.0 in | Wt 256.0 lb

## 2017-01-31 DIAGNOSIS — E119 Type 2 diabetes mellitus without complications: Secondary | ICD-10-CM

## 2017-01-31 DIAGNOSIS — I10 Essential (primary) hypertension: Secondary | ICD-10-CM

## 2017-01-31 MED ORDER — HYDROCHLOROTHIAZIDE 25 MG PO TABS: 25.0000 mg | ORAL_TABLET | Freq: Every day | ORAL | 0 refills | Status: DC

## 2017-01-31 NOTE — Patient Instructions (Signed)
Add HCTZ to current blood pressure regimen and return in 3 weeks

## 2017-01-31 NOTE — Progress Notes (Signed)
   Subjective:    Patient ID: [name] [name], male    DOB: July 09, 1951, 66 y.o.   MRN: [id number]  HPI 66 year old Male for follow-up up of hypertension. In April we switched losartan to Benicar 40 mg daily. He's not on diuretic. He is on amlodipine 5 mg daily. Despite this his blood pressure is still 369 systolically. Diastolic is 80. He also has diabetes mellitus and is on Lantus insulin which he says has become quite expensive since he is entering the doughnut hole on Medicare.        Review of Systems see above   Objective:   Physical Exam Skin warm and dry. Chest clear. Cardiac exam regular rate and rhythm. Extremities without edema.        Assessment & Plan:   Elevated systolic blood pressure  Controlled type 2 diabetes-hemoglobin A1c checked in April  Plan: Add HCTZ 25 mg daily to Benicar and follow-up in 3 weeks. Continue amlodipine 5 mg daily. May need to add Bystolic. He will need serum potassium when he returns along with brief office visit and blood pressure check.

## 2017-02-02 ENCOUNTER — Encounter: Payer: Self-pay | Admitting: Internal Medicine

## 2017-02-02 ENCOUNTER — Other Ambulatory Visit: Payer: Self-pay | Admitting: Internal Medicine

## 2017-02-02 MED ORDER — HYDROCHLOROTHIAZIDE 25 MG PO TABS: 25.0000 mg | ORAL_TABLET | Freq: Every day | ORAL | 0 refills | Status: DC

## 2017-02-02 NOTE — Telephone Encounter (Signed)
Patient apparently wanted this prescription for HCTZ 25 mg daily called to [facility] but was sent to [facility] because that is the last pharmacy we had on file.

## 2017-02-03 ENCOUNTER — Other Ambulatory Visit: Payer: Self-pay | Admitting: *Deleted

## 2017-02-03 ENCOUNTER — Other Ambulatory Visit: Payer: Self-pay | Admitting: Internal Medicine

## 2017-02-03 NOTE — Telephone Encounter (Signed)
Only want 30 days because I may need to change it so NO 90 day RX

## 2017-02-03 NOTE — Patient Outreach (Signed)
[facility] Screening call, unsuccessful but left a message for a return call. If I do not hear back from the member I will try again within the week.  Oreoluwa Aigner C. Sallyann Kinnaird, MSN, GNP-BC Gerontological Nurse Practitioner [facility] Care Management [phone]  

## 2017-02-04 ENCOUNTER — Encounter: Payer: Self-pay | Admitting: *Deleted

## 2017-02-04 ENCOUNTER — Other Ambulatory Visit: Payer: Self-pay | Admitting: *Deleted

## 2017-02-04 NOTE — Patient Outreach (Signed)
[facility] Screen. Pt sees primary care MD routinely. He is really working on his diabetes management. He has reduced his HgbA1C from mid 7's to 6.8. He has lost 10# in the last 3 months. He also is under treatment for HTN.  Takes medications as ordered for chronic problems.  He does anticipate going into the donut hole and would like to see if our pharmacy team may be able to help him with resources to reduce his medication costs druing that time. No acute health concerns.   I am referring pt to the pharmacy team for pharmacy assistance.   Introductory letter sent.  Eulah Pont. Myrtie Neither, MSN, [facility] Gerontological Nurse Practitioner [facility] Care Management [phone]

## 2017-02-06 ENCOUNTER — Telehealth: Payer: Self-pay | Admitting: Pharmacist

## 2017-02-06 NOTE — Patient Outreach (Addendum)
[facility]) Care Management  02/06/2017  [name] [name] [birth year] [id number]   Called patient regarding medication assistance per referral from [place] Practitioner, Deloria Lair.  Patient answered the phone but I was unable to get HIPAA identifiers because he said he was not working with [employer], did not recognize [employer] name, was not interested in services, and hung up on me.    [employer] was called on the patient's behalf.  Patient is not currently in the coverage gap and has spent about $290 on medication expenses year to date.  Most of his medications are generic with the exception of Januvia and Lantus.  If the patient meets financial requirements, he could be eligible for [employer]'s patient assistance program.  Unfortunately, the patient has not met the out-of-pocket medication expenditure amount (>$1000) to receive insulin from a patient assistance program.  Plan:  I will route this note to Kayleen Memos to see if she wants to reach back out to the patient and explain about me calling.  Close pharmacy case if patient is truly not interested.  [name] [name], PharmD, [place] Clinical Pharmacist [phone]

## 2017-02-18 ENCOUNTER — Other Ambulatory Visit: Payer: Self-pay | Admitting: Internal Medicine

## 2017-02-18 NOTE — Telephone Encounter (Signed)
Refill x 6 months 

## 2017-02-21 ENCOUNTER — Encounter: Payer: Self-pay | Admitting: Pharmacist

## 2017-02-24 ENCOUNTER — Other Ambulatory Visit: Payer: Self-pay | Admitting: Pharmacist

## 2017-02-24 NOTE — Patient Outreach (Signed)
[facility]) Care Management  02/24/2017  ELIU BATCH 10-Sep-1950 [id number]   Called patient regarding medication assistance today. Patient answer the phone. Once I described who I was and why I was calling, he replied "I have all that taken care of. Thank you" and hung up.  Today was the second call to the patient and on both occasions he was not interested in services.  Patient is not currently in the coverage gap and has spent about $290 on medication expenses year to date.  Most of his medications are generic with the exception of Januvia and Lantus.  If the patient meets financial requirements, he could be eligible for [employer]'s patient assistance program.  Unfortunately, the patient has not met the out-of-pocket medication expenditure amount (>$1000) to receive insulin from a patient assistance program.  Pharmacy case will be closed due to denial of services.   Elayne Guerin, PharmD, [place] Clinical Pharmacist [phone]

## 2017-02-25 ENCOUNTER — Ambulatory Visit (INDEPENDENT_AMBULATORY_CARE_PROVIDER_SITE_OTHER): Payer: PPO | Admitting: Internal Medicine

## 2017-02-25 ENCOUNTER — Encounter: Payer: Self-pay | Admitting: Internal Medicine

## 2017-02-25 VITALS — BP 148/84 | HR 82 | Temp 97.4°F | Wt 257.0 lb

## 2017-02-25 DIAGNOSIS — I10 Essential (primary) hypertension: Secondary | ICD-10-CM | POA: Diagnosis not present

## 2017-02-25 NOTE — Progress Notes (Signed)
   Subjective:    Patient ID: [name] [name], male    DOB: Jun 13, 1951, 66 y.o.   MRN: [id number]  HPI  66 year old Male for follow up of HTN.He is on Benicar, amlodipine and HCTZ. He had all these medications a couple of hours before coming to the office.    Review of Systems see above     Objective:   Physical Exam  Chest clear. Cardiac exam regular rate and rhythm. Extremities without edema Blood pressure 148/84, pulse 82. Weight 257 pounds.     Assessment & Plan:  Persistently elevated blood pressure  Plan: I've given him samples of Bystolic 5 mg daily and he will return in 2 weeks. He'll continue Benicar and HCTZ as well as amlodipine.

## 2017-02-25 NOTE — Patient Instructions (Signed)
Add Bystolic 5 mg daily to current regimen and return in 2 weeks

## 2017-02-26 ENCOUNTER — Other Ambulatory Visit: Payer: Self-pay | Admitting: Internal Medicine

## 2017-03-18 ENCOUNTER — Encounter: Payer: Self-pay | Admitting: Internal Medicine

## 2017-03-18 ENCOUNTER — Ambulatory Visit (INDEPENDENT_AMBULATORY_CARE_PROVIDER_SITE_OTHER): Payer: PPO | Admitting: Internal Medicine

## 2017-03-18 VITALS — BP 120/78 | HR 67 | Temp 97.1°F | Wt 254.0 lb

## 2017-03-18 DIAGNOSIS — I10 Essential (primary) hypertension: Secondary | ICD-10-CM

## 2017-03-18 DIAGNOSIS — E119 Type 2 diabetes mellitus without complications: Secondary | ICD-10-CM | POA: Diagnosis not present

## 2017-03-18 NOTE — Patient Instructions (Signed)
Continue to monitor blood pressure at home. Bring monitor in from home for [place] to check it against our readings here. Basic metabolic panel drawn today. Physical exam due April.

## 2017-03-18 NOTE — Progress Notes (Signed)
   Subjective:    Patient ID: [name] [name], male    DOB: Jan 20, 1951, 66 y.o.   MRN: [id number]  HPI  66 year old Male for blood pressure check. At last visit, he was placed on HCTZ in addition to Benicar and amlodipine. His feet are not swelling. His blood pressure is excellent today although he brings in multiple readings over the past few weeks however they're not very good from his home blood pressure monitor. I have asked him to bring in his home blood pressure monitor in the near future.  He has blood pressure readings ranging from 120/69 on August 19 to 150/86 August 20 144/78 August 21 and 174/91 on August 29. 164/84 on September 2.  He took his blood pressure medications this morning on an empty stomach before breakfast.  A basic metabolic panel was drawn today. He agrees to bring in his blood pressure monitor from home and we will see if it matches what we get here in the office.      Review of Systems see above skin warm and dry. Nodes none. No carotid bruits. Chest clear. Cardiac exam regular rate and rhythm normal S1 and S2. Extremities without edema     Objective:   Physical Exam Skin warm and dry. Nodes none. No carotid bruits. Chest clear. Cardiac exam: regular rate and rhythm normal S1 and S2. Extremities without edema.       Assessment & Plan:  Blood pressure normal today. Be met is pending. Plan: His physical exam is due in April. He will continue to monitor his Accu-Cheks and blood pressure at home and call me if he has issues. I want him to bring his blood pressure monitor by here one day for [place] to check it against our readings.

## 2017-03-19 LAB — BASIC METABOLIC PANEL
BUN: 14 mg/dL (ref 7–25)
CO2: 24 mmol/L (ref 20–32)
Calcium: 9.5 mg/dL (ref 8.6–10.3)
Chloride: 93 mmol/L — ABNORMAL LOW (ref 98–110)
Creat: 0.92 mg/dL (ref 0.70–1.25)
Glucose, Bld: 152 mg/dL — ABNORMAL HIGH (ref 65–99)
Potassium: 4.6 mmol/L (ref 3.5–5.3)
Sodium: 130 mmol/L — ABNORMAL LOW (ref 135–146)

## 2017-03-30 ENCOUNTER — Other Ambulatory Visit: Payer: Self-pay | Admitting: Internal Medicine

## 2017-04-02 DIAGNOSIS — H5213 Myopia, bilateral: Secondary | ICD-10-CM | POA: Diagnosis not present

## 2017-04-02 LAB — HM DIABETES EYE EXAM

## 2017-05-30 ENCOUNTER — Telehealth: Payer: Self-pay

## 2017-05-30 NOTE — Telephone Encounter (Signed)
Called pt per Dr. Lauree Chandler request to ask about a Statin medication, the pt is already on Lipitor

## 2017-06-05 ENCOUNTER — Other Ambulatory Visit: Payer: Self-pay | Admitting: Internal Medicine

## 2017-06-29 ENCOUNTER — Other Ambulatory Visit: Payer: Self-pay | Admitting: Internal Medicine

## 2017-07-04 ENCOUNTER — Other Ambulatory Visit: Payer: Self-pay | Admitting: Internal Medicine

## 2017-07-09 ENCOUNTER — Other Ambulatory Visit: Payer: Self-pay | Admitting: Internal Medicine

## 2017-07-15 ENCOUNTER — Other Ambulatory Visit: Payer: Self-pay | Admitting: Internal Medicine

## 2017-07-27 ENCOUNTER — Other Ambulatory Visit: Payer: Self-pay | Admitting: Internal Medicine

## 2017-08-17 ENCOUNTER — Other Ambulatory Visit: Payer: Self-pay | Admitting: Internal Medicine

## 2017-08-24 ENCOUNTER — Other Ambulatory Visit: Payer: Self-pay | Admitting: Internal Medicine

## 2017-09-03 ENCOUNTER — Other Ambulatory Visit: Payer: Self-pay | Admitting: Internal Medicine

## 2017-09-05 ENCOUNTER — Other Ambulatory Visit: Payer: Self-pay | Admitting: Internal Medicine

## 2017-09-18 ENCOUNTER — Ambulatory Visit (INDEPENDENT_AMBULATORY_CARE_PROVIDER_SITE_OTHER): Payer: PPO | Admitting: Internal Medicine

## 2017-09-18 VITALS — BP 120/80 | HR 94 | Temp 98.3°F | Wt 254.0 lb

## 2017-09-18 DIAGNOSIS — R52 Pain, unspecified: Secondary | ICD-10-CM | POA: Diagnosis not present

## 2017-09-18 DIAGNOSIS — R059 Cough, unspecified: Secondary | ICD-10-CM

## 2017-09-18 DIAGNOSIS — I10 Essential (primary) hypertension: Secondary | ICD-10-CM | POA: Diagnosis not present

## 2017-09-18 DIAGNOSIS — J101 Influenza due to other identified influenza virus with other respiratory manifestations: Secondary | ICD-10-CM | POA: Diagnosis not present

## 2017-09-18 DIAGNOSIS — E119 Type 2 diabetes mellitus without complications: Secondary | ICD-10-CM | POA: Diagnosis not present

## 2017-09-18 DIAGNOSIS — R05 Cough: Secondary | ICD-10-CM | POA: Diagnosis not present

## 2017-09-18 DIAGNOSIS — R6883 Chills (without fever): Secondary | ICD-10-CM | POA: Diagnosis not present

## 2017-09-18 LAB — POCT GLUCOSE (DEVICE FOR HOME USE): POC Glucose: 186 mg/dl — AB (ref 70–99)

## 2017-09-18 LAB — POCT INFLUENZA A/B: Influenza A, POC: POSITIVE — AB

## 2017-09-18 MED ORDER — AZITHROMYCIN 250 MG PO TABS: ORAL_TABLET | ORAL | 0 refills | Status: DC

## 2017-09-18 MED ORDER — HYDROCODONE-HOMATROPINE 5-1.5 MG/5ML PO SYRP: 5.0000 mL | ORAL_SOLUTION | Freq: Three times a day (TID) | ORAL | 0 refills | Status: DC | PRN

## 2017-09-18 MED ORDER — OSELTAMIVIR PHOSPHATE 75 MG PO CAPS: 75.0000 mg | ORAL_CAPSULE | Freq: Two times a day (BID) | ORAL | 0 refills | Status: DC

## 2017-09-18 NOTE — Progress Notes (Signed)
   Subjective:    Patient ID: [name] [name], male    DOB: Dec 15, 1950, 67 y.o.   MRN: [id number]  HPI 67 year old Male in today with acute illness.  Complains of fever myalgias and chills.  Was around a workman last week that apparently had the flu.  He did get a flu vaccine in November.  Feels awful.  Moving slowly.   Review of Systems see above     Objective:   Physical Exam Skin warm and dry.  Pharynx very slightly injected without exudate.  TMs are clear.  Neck supple.  Chest clear to auscultation.  Rapid flu test is positive       Assessment & Plan:  Influenza A  Plan: Tamiflu 75 mg twice daily for 5 days.  Zithromax Z-Pak take as directed 2 p.o. day 1 followed by 1 p.o. days 2 through 5.  Hycodan 1 teaspoon p.o. every 8 hours as needed cough.  Rest and drink plenty of fluids.

## 2017-09-18 NOTE — Patient Instructions (Signed)
Rest and drink plenty of fluids.  Tamiflu 75 mg twice daily for 5 days.  Zithromax Z-Pak take as directed.  Hycodan 1 teaspoon p.o. every 8 hours as needed cough

## 2017-10-09 ENCOUNTER — Encounter: Payer: Self-pay | Admitting: Internal Medicine

## 2017-10-11 ENCOUNTER — Telehealth: Payer: Self-pay | Admitting: Internal Medicine

## 2017-10-11 ENCOUNTER — Other Ambulatory Visit: Payer: Self-pay | Admitting: Internal Medicine

## 2017-10-11 ENCOUNTER — Encounter: Payer: Self-pay | Admitting: Internal Medicine

## 2017-10-11 NOTE — Telephone Encounter (Signed)
Need to clarify which he is taking and can afford. Januvia or Onglyza. Will leave message with pharmacy and with pt. Needs one or the other.   Pt says he is on Januvia. Not on Onglyza.  He is no longer on Losartan. He is to be on Olmesartan.

## 2017-10-29 ENCOUNTER — Other Ambulatory Visit: Payer: Self-pay | Admitting: Internal Medicine

## 2017-11-14 ENCOUNTER — Other Ambulatory Visit: Payer: Self-pay | Admitting: Internal Medicine

## 2017-11-24 ENCOUNTER — Other Ambulatory Visit: Payer: Self-pay | Admitting: Internal Medicine

## 2017-11-30 ENCOUNTER — Other Ambulatory Visit: Payer: Self-pay | Admitting: Internal Medicine

## 2017-12-06 ENCOUNTER — Other Ambulatory Visit: Payer: Self-pay | Admitting: Internal Medicine

## 2017-12-07 ENCOUNTER — Other Ambulatory Visit: Payer: Self-pay | Admitting: Internal Medicine

## 2017-12-07 MED ORDER — METFORMIN HCL 1000 MG PO TABS: 1000.0000 mg | ORAL_TABLET | Freq: Every day | ORAL | 0 refills | Status: DC

## 2017-12-12 ENCOUNTER — Other Ambulatory Visit: Payer: Self-pay | Admitting: Internal Medicine

## 2017-12-12 NOTE — Telephone Encounter (Signed)
Please call pharmacy. He is NOT to be on Losartan. He is on Benicar (olmesartan)

## 2017-12-22 ENCOUNTER — Other Ambulatory Visit: Payer: Self-pay

## 2017-12-23 ENCOUNTER — Other Ambulatory Visit: Payer: Self-pay

## 2017-12-23 DIAGNOSIS — E785 Hyperlipidemia, unspecified: Secondary | ICD-10-CM

## 2017-12-23 DIAGNOSIS — E8881 Metabolic syndrome: Secondary | ICD-10-CM

## 2017-12-23 DIAGNOSIS — Z125 Encounter for screening for malignant neoplasm of prostate: Secondary | ICD-10-CM

## 2017-12-23 DIAGNOSIS — E119 Type 2 diabetes mellitus without complications: Secondary | ICD-10-CM

## 2017-12-23 DIAGNOSIS — N529 Male erectile dysfunction, unspecified: Secondary | ICD-10-CM

## 2017-12-23 DIAGNOSIS — Z0000 Encounter for general adult medical examination without abnormal findings: Secondary | ICD-10-CM

## 2017-12-23 DIAGNOSIS — I10 Essential (primary) hypertension: Secondary | ICD-10-CM

## 2017-12-24 ENCOUNTER — Other Ambulatory Visit: Payer: Self-pay | Admitting: Internal Medicine

## 2018-01-04 ENCOUNTER — Other Ambulatory Visit: Payer: Self-pay | Admitting: Internal Medicine

## 2018-01-07 ENCOUNTER — Other Ambulatory Visit: Payer: Self-pay | Admitting: Internal Medicine

## 2018-01-08 ENCOUNTER — Other Ambulatory Visit: Payer: PPO | Admitting: Internal Medicine

## 2018-01-08 DIAGNOSIS — E8881 Metabolic syndrome: Secondary | ICD-10-CM

## 2018-01-08 DIAGNOSIS — E785 Hyperlipidemia, unspecified: Secondary | ICD-10-CM | POA: Diagnosis not present

## 2018-01-08 DIAGNOSIS — N529 Male erectile dysfunction, unspecified: Secondary | ICD-10-CM

## 2018-01-08 DIAGNOSIS — E119 Type 2 diabetes mellitus without complications: Secondary | ICD-10-CM

## 2018-01-08 DIAGNOSIS — I10 Essential (primary) hypertension: Secondary | ICD-10-CM | POA: Diagnosis not present

## 2018-01-08 DIAGNOSIS — Z125 Encounter for screening for malignant neoplasm of prostate: Secondary | ICD-10-CM

## 2018-01-08 DIAGNOSIS — Z0000 Encounter for general adult medical examination without abnormal findings: Secondary | ICD-10-CM

## 2018-01-09 LAB — COMPLETE METABOLIC PANEL WITH GFR
AG Ratio: 2 (calc) (ref 1.0–2.5)
ALT: 25 U/L (ref 9–46)
AST: 23 U/L (ref 10–35)
Albumin: 4.5 g/dL (ref 3.6–5.1)
Alkaline phosphatase (APISO): 22 U/L — ABNORMAL LOW (ref 40–115)
BUN: 16 mg/dL (ref 7–25)
CO2: 28 mmol/L (ref 20–32)
Calcium: 9.8 mg/dL (ref 8.6–10.3)
Chloride: 101 mmol/L (ref 98–110)
Creat: 1.02 mg/dL (ref 0.70–1.25)
GFR, Est African American: 88 mL/min/{1.73_m2} (ref >=60)
GFR, Est Non African American: 76 mL/min/{1.73_m2} (ref >=60)
Globulin: 2.3 g/dL (calc) (ref 1.9–3.7)
Glucose, Bld: 102 mg/dL — ABNORMAL HIGH (ref 65–99)
Potassium: 5.2 mmol/L (ref 3.5–5.3)
Sodium: 134 mmol/L — ABNORMAL LOW (ref 135–146)
Total Bilirubin: 0.6 mg/dL (ref 0.2–1.2)
Total Protein: 6.8 g/dL (ref 6.1–8.1)

## 2018-01-09 LAB — CBC WITH DIFFERENTIAL/PLATELET
Basophils Absolute: 58 cells/uL (ref 0–200)
Basophils Relative: 1.1 %
Eosinophils Absolute: 133 cells/uL (ref 15–500)
Eosinophils Relative: 2.5 %
HCT: 40.2 % (ref 38.5–50.0)
Hemoglobin: 13.5 g/dL (ref 13.2–17.1)
Lymphs Abs: 1791 cells/uL (ref 850–3900)
MCH: 30.3 pg (ref 27.0–33.0)
MCHC: 33.6 g/dL (ref 32.0–36.0)
MCV: 90.3 fL (ref 80.0–100.0)
MPV: 10.7 fL (ref 7.5–12.5)
Monocytes Relative: 8.5 %
Neutro Abs: 2867 cells/uL (ref 1500–7800)
Neutrophils Relative %: 54.1 %
Platelets: 228 10*3/uL (ref 140–400)
RBC: 4.45 10*6/uL (ref 4.20–5.80)
RDW: 12.7 % (ref 11.0–15.0)
Total Lymphocyte: 33.8 %
WBC mixed population: 451 cells/uL (ref 200–950)
WBC: 5.3 10*3/uL (ref 3.8–10.8)

## 2018-01-09 LAB — MICROALBUMIN / CREATININE URINE RATIO
Creatinine, Urine: 68 mg/dL (ref 20–320)
Microalb Creat Ratio: 9 mcg/mg creat (ref <30)
Microalb, Ur: 0.6 mg/dL

## 2018-01-09 LAB — LIPID PANEL
Cholesterol: 122 mg/dL (ref <200)
HDL: 41 mg/dL (ref >40)
LDL Cholesterol (Calc): 66 mg/dL (calc)
Non-HDL Cholesterol (Calc): 81 mg/dL (calc) (ref <130)
Total CHOL/HDL Ratio: 3 (calc) (ref <5.0)
Triglycerides: 74 mg/dL (ref <150)

## 2018-01-09 LAB — PSA: PSA: 2.9 ng/mL (ref <=4.0)

## 2018-01-09 LAB — HEMOGLOBIN A1C
Hgb A1c MFr Bld: 5.8 % of total Hgb — ABNORMAL HIGH (ref <5.7)
Mean Plasma Glucose: 120 (calc)
eAG (mmol/L): 6.6 (calc)

## 2018-01-12 ENCOUNTER — Ambulatory Visit (INDEPENDENT_AMBULATORY_CARE_PROVIDER_SITE_OTHER): Payer: PPO | Admitting: Internal Medicine

## 2018-01-12 ENCOUNTER — Encounter: Payer: Self-pay | Admitting: Internal Medicine

## 2018-01-12 VITALS — BP 150/80 | HR 85 | Ht 72.0 in | Wt 244.0 lb

## 2018-01-12 DIAGNOSIS — I10 Essential (primary) hypertension: Secondary | ICD-10-CM | POA: Diagnosis not present

## 2018-01-12 DIAGNOSIS — M5412 Radiculopathy, cervical region: Secondary | ICD-10-CM

## 2018-01-12 DIAGNOSIS — E1169 Type 2 diabetes mellitus with other specified complication: Secondary | ICD-10-CM | POA: Diagnosis not present

## 2018-01-12 DIAGNOSIS — F325 Major depressive disorder, single episode, in full remission: Secondary | ICD-10-CM

## 2018-01-12 DIAGNOSIS — E785 Hyperlipidemia, unspecified: Secondary | ICD-10-CM | POA: Diagnosis not present

## 2018-01-12 DIAGNOSIS — Z0000 Encounter for general adult medical examination without abnormal findings: Secondary | ICD-10-CM

## 2018-01-12 DIAGNOSIS — N529 Male erectile dysfunction, unspecified: Secondary | ICD-10-CM

## 2018-01-12 DIAGNOSIS — G8929 Other chronic pain: Secondary | ICD-10-CM

## 2018-01-12 DIAGNOSIS — Z6833 Body mass index (BMI) 33.0-33.9, adult: Secondary | ICD-10-CM | POA: Diagnosis not present

## 2018-01-12 DIAGNOSIS — M542 Cervicalgia: Secondary | ICD-10-CM

## 2018-01-12 DIAGNOSIS — E8881 Metabolic syndrome: Secondary | ICD-10-CM

## 2018-01-12 DIAGNOSIS — R29898 Other symptoms and signs involving the musculoskeletal system: Secondary | ICD-10-CM

## 2018-01-12 LAB — POCT URINALYSIS DIPSTICK
Appearance: NORMAL
Bilirubin, UA: NEGATIVE
Blood, UA: NEGATIVE
Glucose, UA: NEGATIVE
Ketones, UA: NEGATIVE
Leukocytes, UA: NEGATIVE
Nitrite, UA: NEGATIVE
Odor: NORMAL
Protein, UA: NEGATIVE
Spec Grav, UA: 1.01 (ref 1.010–1.025)
Urobilinogen, UA: 0.2 E.U./dL
pH, UA: 6.5 (ref 5.0–8.0)

## 2018-01-12 MED ORDER — TAMSULOSIN HCL 0.4 MG PO CAPS: 0.4000 mg | ORAL_CAPSULE | Freq: Every day | ORAL | 3 refills | Status: DC

## 2018-01-12 MED ORDER — SITAGLIPTIN PHOSPHATE 100 MG PO TABS: ORAL_TABLET | ORAL | 0 refills | Status: DC

## 2018-01-20 DIAGNOSIS — Z85828 Personal history of other malignant neoplasm of skin: Secondary | ICD-10-CM | POA: Diagnosis not present

## 2018-01-20 DIAGNOSIS — D225 Melanocytic nevi of trunk: Secondary | ICD-10-CM | POA: Diagnosis not present

## 2018-01-20 DIAGNOSIS — C44319 Basal cell carcinoma of skin of other parts of face: Secondary | ICD-10-CM | POA: Diagnosis not present

## 2018-01-20 DIAGNOSIS — L570 Actinic keratosis: Secondary | ICD-10-CM | POA: Diagnosis not present

## 2018-01-20 DIAGNOSIS — D485 Neoplasm of uncertain behavior of skin: Secondary | ICD-10-CM | POA: Diagnosis not present

## 2018-02-01 ENCOUNTER — Other Ambulatory Visit: Payer: Self-pay | Admitting: Internal Medicine

## 2018-02-05 ENCOUNTER — Ambulatory Visit
Admission: RE | Admit: 2018-02-05 | Discharge: 2018-02-05 | Disposition: A | Discharge status: Home / Self Care | Payer: PPO | Source: Ambulatory Visit | Attending: Internal Medicine | Admitting: Internal Medicine

## 2018-02-05 DIAGNOSIS — G8929 Other chronic pain: Secondary | ICD-10-CM

## 2018-02-05 DIAGNOSIS — M4802 Spinal stenosis, cervical region: Secondary | ICD-10-CM | POA: Diagnosis not present

## 2018-02-05 DIAGNOSIS — M5412 Radiculopathy, cervical region: Secondary | ICD-10-CM | POA: Insufficient documentation

## 2018-02-05 DIAGNOSIS — M542 Cervicalgia: Principal | ICD-10-CM

## 2018-02-05 DIAGNOSIS — R29898 Other symptoms and signs involving the musculoskeletal system: Secondary | ICD-10-CM

## 2018-02-05 NOTE — Patient Instructions (Signed)
Return in late July for repeat blood pressure check.  We will get referral to neurosurgeon regarding cervical spinal stenosis.  Continue same medications.

## 2018-02-05 NOTE — Progress Notes (Signed)
Subjective:    Patient ID: [name] [name], male    DOB: 16-Apr-1951, 67 y.o.   MRN: [id number]  HPI 67 year old Male for Medicare wellness, health maintenance exam and evaluation of medical issues.  History of hypertension, diabetes mellitus, hyperlipidemia, obesity, metabolic syndrome.  History of erectile dysfunction and gout.  History of back pain status post L2-L3 discectomy March 2014 for right lower extremity radiculopathy and back pain.  History of BPH.  Colonoscopy 2010 with history of adenomatous colon polyps.  Needs repeat study.  Cardiolite study 2004 which was negative.  Pneumovax and Prevnar immunizations up-to-date.  Ophthalmologist is in [facility].  Reminded about annual diabetic eye exam.  Dr. Daylene Katayama saw him in 2007.  He was felt to have degenerative arthritis in his [facility] joint of the right shoulder.  In March 1999 he had a partial tear of his gastrocnemius muscle in the left leg treated by Dr. Sharol Given.  He had a cyst removed from his spine in 1977 and may actually had a pilonidal cyst.  History of lumbar disc disease L4-L5 and L5-S1 in 1998.  He presented with left lumbar radiculopathy at the time.  He received epidural steroids x3 with improvement.  Remote history of depression treated with antidepressants and stable.  Currently he is not depressed.  Social history: He is married.  First wife died of cancer.  This is a second marriage.  Completed 1 year of college.  Enjoys golf.  Does not smoke.  Drinks beer.  2 adult children, a son and a daughter.  He is a self-employed Hydrologist.  Tetanus immunization update given 2014.  This past winter, he had influenza.  Family history: Mother died of stomach cancer at age 50.  Father died of lung cancer at age 50.  They apparently died within 26 months of age when he was 19 or 15 years old.  3 sisters in good health.    Review of Systems  Neurological:       Neck pain with radiculopathy  - been having issues with  neck pain rather severe with radiculopathy.  Have MRI of the C-spine in the near future.     Objective:   Physical Exam  Constitutional: He is oriented to person, place, and time. He appears well-developed and well-nourished. No distress.  HENT:  Head: Normocephalic and atraumatic.  Right Ear: External ear normal.  Left Ear: External ear normal.  Mouth/Throat: Oropharynx is clear and moist. No oropharyngeal exudate.  Eyes: Pupils are equal, round, and reactive to light. Conjunctivae and EOM are normal. Right eye exhibits no discharge. Left eye exhibits no discharge. No scleral icterus.  Neck: Neck supple. No JVD present. No thyromegaly present.  Cardiovascular: Normal rate, regular rhythm and normal heart sounds.  No murmur heard. Pulmonary/Chest: Effort normal and breath sounds normal. No stridor. No respiratory distress. He has no wheezes.  Abdominal: Soft. Bowel sounds are normal. He exhibits no distension and no mass. There is no tenderness. There is no rebound and no guarding. No hernia.  Genitourinary: Prostate normal.  Musculoskeletal: He exhibits no edema.  No weakness of the upper extremities  Lymphadenopathy:    He has no cervical adenopathy.  Neurological: He is alert and oriented to person, place, and time. No cranial nerve deficit. Coordination normal.  Skin: Skin is warm and dry. No rash noted. He is not diaphoretic. No erythema.  Psychiatric: He has a normal mood and affect. His behavior is normal. Judgment and thought content  normal.          Assessment & Plan:  Cervical radiculopathy/suspect disc.  Have MRI of C-spine Flexeril prescribed  Essential hypertension-blood pressure is 150/80.  Needs to monitor.  Is on multidrug therapy.  Patient is to monitor this at home and call me with readings  Diabetes mellitus-hemoglobin A1c stable at 4.0%  Metabolic syndrome-needs to lose weight  Hyperlipidemia-normal on statin medication  History of gout-no recent  recurrences.  Is on allopurinol  History of depression in remission.  Continue antidepressant medication  Erectile dysfunction  History of BPH will be treated with Flomax  History of adenomatous colon polyps-patient reminded to call gastroenterologist for repeat colonoscopy  Spent considerable time going over his medications today.  He has had some acute confusion with his medications.  I think we have it straightened out now.  He needs to monitor his blood pressure and call with some readings in a couple of weeks.  Subjective:   Patient presents for Medicare Annual/Subsequent preventive examination.  Review Past Medical/Family/Social: See above   Risk Factors  Current exercise habits: Works with his job is Hydrologist and plays golf Dietary issues discussed: Low-fat low carbohydrate.  Cut back on beer consumption.  Cardiac risk factors: Diabetes, hyperlipidemia, hypertension  Depression Screen  (Note: if answer to either of the following is "Yes", a more complete depression screening is indicated)   Over the past two weeks, have you felt down, depressed or hopeless? No  Over the past two weeks, have you felt little interest or pleasure in doing things? No Have you lost interest or pleasure in daily life? No Do you often feel hopeless? No Do you cry easily over simple problems? No   Activities of Daily Living  In your present state of health, do you have any difficulty performing the following activities?:   Driving? No  Managing money? No  Feeding yourself? No  Getting from bed to chair? No  Climbing a flight of stairs? No  Preparing food and eating?: No  Bathing or showering? No  Getting dressed: No  Getting to the toilet? No  Using the toilet:No  Moving around from place to place: No  In the past year have you fallen or had a near fall?:No  Are you sexually active? No  Do you have more than one partner? No   Hearing Difficulties: No  Do you often ask  people to speak up or repeat themselves? No  Do you experience ringing or noises in your ears? No  Do you have difficulty understanding soft or whispered voices? No  Do you feel that you have a problem with memory? No Do you often misplace items? No    Home Safety:  Do you have a smoke alarm at your residence? Yes Do you have grab bars in the bathroom?  No Do you have throw rugs in your house?  No   Cognitive Testing  Alert? Yes Normal Appearance?Yes  Oriented to person? Yes Place? Yes  Time? Yes  Recall of three objects? Yes  Can perform simple calculations? Yes  Displays appropriate judgment?Yes  Can read the correct time from a watch face?Yes   List the Names of Other Physician/Practitioners you currently use:  See referral list for the physicians patient is currently seeing.     Review of Systems- see above:   Objective:     General appearance: Appears stated age and mildly obese  Head: Normocephalic, without obvious abnormality, atraumatic  Eyes: conj clear, EOMi  PEERLA  Ears: normal TM's and external ear canals both ears  Nose: Nares normal. Septum midline. Mucosa normal. No drainage or sinus tenderness.  Throat: lips, mucosa, and tongue normal; teeth and gums normal  Neck: no adenopathy, no carotid bruit, no JVD, supple, symmetrical, trachea midline and thyroid not enlarged, symmetric, no tenderness/mass/nodules  No CVA tenderness.  Lungs: clear to auscultation bilaterally  Breasts: normal appearance, no masses or tenderness see above Heart: regular rate and rhythm, S1, S2 normal, no murmur, click, rub or gallop  Abdomen: soft, non-tender; bowel sounds normal; no masses, no organomegaly  Musculoskeletal: ROM normal in all joints, no crepitus, no deformity, Normal muscle strengthen. Back  is symmetric, no curvature. Skin: Skin color, texture, turgor normal. No rashes or lesions  Lymph nodes: Cervical, supraclavicular, and axillary nodes normal.  Neurologic: CN 2  -12 Normal, Normal symmetric reflexes. Normal coordination and gait  Psych: Alert & Oriented x 3, Mood appear stable.    Assessment:    Annual wellness medicare exam   Plan:    During the course of the visit the patient was educated and counseled about appropriate screening and preventive services including:   See above     Patient Instructions (the written plan) was given to the patient.  Medicare Attestation  I have personally reviewed:  The patient's medical and social history  Their use of alcohol, tobacco or illicit drugs  Their current medications and supplements  The patient's functional ability including ADLs,fall risks, home safety risks, cognitive, and hearing and visual impairment  Diet and physical activities  Evidence for depression or mood disorders  The patient's weight, height, BMI, and visual acuity have been recorded in the chart. I have made referrals, counseling, and provided education to the patient based on review of the above and I have provided the patient with a written personalized care plan for preventive services.

## 2018-02-11 ENCOUNTER — Ambulatory Visit (INDEPENDENT_AMBULATORY_CARE_PROVIDER_SITE_OTHER): Payer: PPO | Admitting: Internal Medicine

## 2018-02-11 ENCOUNTER — Encounter: Payer: Self-pay | Admitting: Internal Medicine

## 2018-02-11 VITALS — BP 120/70 | HR 82 | Ht 72.0 in | Wt 247.0 lb

## 2018-02-11 DIAGNOSIS — I10 Essential (primary) hypertension: Secondary | ICD-10-CM | POA: Diagnosis not present

## 2018-02-11 NOTE — Patient Instructions (Signed)
Continue same meds and follow up in 6 months

## 2018-02-11 NOTE — Progress Notes (Signed)
   Subjective:    Patient ID: [name] [name], male    DOB: 25-Nov-1950, 67 y.o.   MRN: [id number]  HPI Nurse visit for BP check as BP was elevated at recent CPE.   BP now 120/70    Review of Systems     Objective:   Physical Exam        Assessment & Plan:  Not seen by MD. BP stable on current regimen

## 2018-02-18 DIAGNOSIS — C44319 Basal cell carcinoma of skin of other parts of face: Secondary | ICD-10-CM | POA: Diagnosis not present

## 2018-02-18 DIAGNOSIS — Z85828 Personal history of other malignant neoplasm of skin: Secondary | ICD-10-CM | POA: Diagnosis not present

## 2018-02-20 ENCOUNTER — Other Ambulatory Visit: Payer: Self-pay | Admitting: Internal Medicine

## 2018-02-21 ENCOUNTER — Other Ambulatory Visit: Payer: Self-pay | Admitting: Internal Medicine

## 2018-03-03 DIAGNOSIS — M4012 Other secondary kyphosis, cervical region: Secondary | ICD-10-CM | POA: Diagnosis not present

## 2018-03-03 DIAGNOSIS — M4712 Other spondylosis with myelopathy, cervical region: Secondary | ICD-10-CM | POA: Diagnosis not present

## 2018-03-03 DIAGNOSIS — M4802 Spinal stenosis, cervical region: Secondary | ICD-10-CM | POA: Diagnosis not present

## 2018-03-03 DIAGNOSIS — M542 Cervicalgia: Secondary | ICD-10-CM | POA: Diagnosis not present

## 2018-03-10 ENCOUNTER — Other Ambulatory Visit: Payer: Self-pay | Admitting: Internal Medicine

## 2018-03-10 NOTE — Telephone Encounter (Signed)
At his physical he was taken off this and put on fenofibrate instead. He needs lipid panel in September with OV. Make sure he is taking fenofibrate and not Atoravastin

## 2018-03-28 ENCOUNTER — Other Ambulatory Visit: Payer: Self-pay | Admitting: Internal Medicine

## 2018-03-31 ENCOUNTER — Other Ambulatory Visit: Payer: PPO | Admitting: Internal Medicine

## 2018-03-31 DIAGNOSIS — E785 Hyperlipidemia, unspecified: Secondary | ICD-10-CM | POA: Diagnosis not present

## 2018-03-31 DIAGNOSIS — I10 Essential (primary) hypertension: Secondary | ICD-10-CM | POA: Diagnosis not present

## 2018-03-31 LAB — LIPID PANEL
Cholesterol: 167 mg/dL (ref <200)
HDL: 48 mg/dL (ref >40)
LDL Cholesterol (Calc): 100 mg/dL (calc) — ABNORMAL HIGH
Non-HDL Cholesterol (Calc): 119 mg/dL (calc) (ref <130)
Total CHOL/HDL Ratio: 3.5 (calc) (ref <5.0)
Triglycerides: 96 mg/dL (ref <150)

## 2018-03-31 LAB — HEPATIC FUNCTION PANEL
AG Ratio: 2.4 (calc) (ref 1.0–2.5)
ALT: 22 U/L (ref 9–46)
AST: 21 U/L (ref 10–35)
Albumin: 4.6 g/dL (ref 3.6–5.1)
Alkaline phosphatase (APISO): 22 U/L — ABNORMAL LOW (ref 40–115)
Bilirubin, Direct: 0.2 mg/dL (ref 0.0–0.2)
Globulin: 1.9 g/dL (calc) (ref 1.9–3.7)
Indirect Bilirubin: 0.4 mg/dL (calc) (ref 0.2–1.2)
Total Bilirubin: 0.6 mg/dL (ref 0.2–1.2)
Total Protein: 6.5 g/dL (ref 6.1–8.1)

## 2018-04-02 ENCOUNTER — Ambulatory Visit: Payer: PPO | Admitting: Internal Medicine

## 2018-04-05 ENCOUNTER — Other Ambulatory Visit: Payer: Self-pay | Admitting: Internal Medicine

## 2018-04-06 ENCOUNTER — Ambulatory Visit (INDEPENDENT_AMBULATORY_CARE_PROVIDER_SITE_OTHER): Payer: PPO | Admitting: Internal Medicine

## 2018-04-06 ENCOUNTER — Encounter: Payer: Self-pay | Admitting: Internal Medicine

## 2018-04-06 VITALS — BP 120/70 | HR 80 | Temp 98.2°F | Ht 72.0 in | Wt 247.0 lb

## 2018-04-06 DIAGNOSIS — E785 Hyperlipidemia, unspecified: Secondary | ICD-10-CM

## 2018-04-06 DIAGNOSIS — Z23 Encounter for immunization: Secondary | ICD-10-CM | POA: Diagnosis not present

## 2018-04-06 DIAGNOSIS — G8929 Other chronic pain: Secondary | ICD-10-CM | POA: Diagnosis not present

## 2018-04-06 DIAGNOSIS — M542 Cervicalgia: Secondary | ICD-10-CM

## 2018-04-06 DIAGNOSIS — I10 Essential (primary) hypertension: Secondary | ICD-10-CM | POA: Diagnosis not present

## 2018-04-06 MED ORDER — METFORMIN HCL 1000 MG PO TABS: ORAL_TABLET | ORAL | 3 refills | Status: DC

## 2018-04-06 MED ORDER — INSULIN GLARGINE 100 UNIT/ML SOLOSTAR PEN: 22.0000 [IU] | PEN_INJECTOR | Freq: Every day | SUBCUTANEOUS | 99 refills | Status: DC

## 2018-04-06 NOTE — Patient Instructions (Addendum)
It was a pleasure to see you today.  Continue to work on weight loss and exercise.  Flu vaccine given.  Blood pressure stable on current regimen.  Continue lipid-lowering medication.  Follow-up in February for  recheck and lab work. Ask your pharmacist why you are running out of Lantus early.

## 2018-04-06 NOTE — Progress Notes (Signed)
   Subjective:    Patient ID: [name] [name], male    DOB: 1950/11/01, 67 y.o.   MRN: [id number]  HPI   67 year old Male for follow up of hyperlipidemia.  Hemoglobin A1c was not checked with this visit but was checked in June and was excellent at 5.8%.  Has a history of hypertension, diabetes mellitus, hyperlipidemia.   Weight was 254 pounds in the Fall 2018. Now weighs 247 pounds. Was 244 pounds in July. Does not play golf now due to back and neck pain.  Has decided to postpone neck surgery because neck pain actually got better when he quit playing golf.  Lipid panel is essentially within normal limits.  LDL is 100, total cholesterol 167 and triglycerides are normal at 96.  Blood pressure was elevated at 150/80 at time of physical exam in July.  At the end of July he was brought back for nurse blood pressure check and blood pressure was 120/70  Review of Systems patient says that he had a pedicure prior to going to the beach a couple of weeks ago and he suffered an abrasion during the procedure of his right lateral foot which was very painful for several days.  There is no evidence of secondary infection.       Objective:   Physical Exam Neck supple.  Chest clear.  Cardiac exam regular rate and rhythm.  Diabetic foot exam done.  No evidence of ulcers or calluses       Assessment & Plan:  Controlled type 2 diabetes mellitus-stable on current regimen  Essential hypertension-normal on current regimen  Hyperlipidemia-stable on lipid-lowering medication  Obesity-needs to be serious about weight loss  Metabolic syndrome  Plan: Continue diet exercise and weight loss.  Follow-up in February.

## 2018-04-09 ENCOUNTER — Other Ambulatory Visit: Payer: Self-pay | Admitting: Internal Medicine

## 2018-04-16 ENCOUNTER — Other Ambulatory Visit: Payer: Self-pay

## 2018-04-16 MED ORDER — METFORMIN HCL 1000 MG PO TABS: ORAL_TABLET | ORAL | 3 refills | Status: DC

## 2018-04-29 ENCOUNTER — Other Ambulatory Visit: Payer: Self-pay | Admitting: Internal Medicine

## 2018-05-05 ENCOUNTER — Other Ambulatory Visit: Payer: Self-pay | Admitting: Internal Medicine

## 2018-05-11 ENCOUNTER — Encounter: Payer: Self-pay | Admitting: Gastroenterology

## 2018-05-12 ENCOUNTER — Other Ambulatory Visit: Payer: Self-pay

## 2018-05-12 MED ORDER — OLMESARTAN MEDOXOMIL 40 MG PO TABS: 40.0000 mg | ORAL_TABLET | Freq: Every day | ORAL | 0 refills | Status: DC

## 2018-05-12 MED ORDER — ALLOPURINOL 300 MG PO TABS: 300.0000 mg | ORAL_TABLET | Freq: Every day | ORAL | 0 refills | Status: DC

## 2018-05-19 DIAGNOSIS — M4712 Other spondylosis with myelopathy, cervical region: Secondary | ICD-10-CM | POA: Diagnosis not present

## 2018-05-21 ENCOUNTER — Other Ambulatory Visit: Payer: Self-pay | Admitting: Internal Medicine

## 2018-06-03 DIAGNOSIS — M4802 Spinal stenosis, cervical region: Secondary | ICD-10-CM | POA: Diagnosis not present

## 2018-06-03 DIAGNOSIS — M5412 Radiculopathy, cervical region: Secondary | ICD-10-CM | POA: Diagnosis not present

## 2018-06-03 DIAGNOSIS — M4722 Other spondylosis with radiculopathy, cervical region: Secondary | ICD-10-CM | POA: Diagnosis not present

## 2018-06-03 DIAGNOSIS — M4712 Other spondylosis with myelopathy, cervical region: Secondary | ICD-10-CM | POA: Diagnosis not present

## 2018-06-25 ENCOUNTER — Other Ambulatory Visit: Payer: Self-pay | Admitting: Internal Medicine

## 2018-06-25 ENCOUNTER — Other Ambulatory Visit: Payer: Self-pay

## 2018-06-25 MED ORDER — ALLOPURINOL 300 MG PO TABS: ORAL_TABLET | ORAL | 3 refills | Status: DC

## 2018-06-25 NOTE — Telephone Encounter (Signed)
Refill x one year °

## 2018-07-16 ENCOUNTER — Ambulatory Visit: Payer: PPO | Admitting: Internal Medicine

## 2018-07-20 DIAGNOSIS — M4012 Other secondary kyphosis, cervical region: Secondary | ICD-10-CM | POA: Diagnosis not present

## 2018-08-02 ENCOUNTER — Other Ambulatory Visit: Payer: Self-pay

## 2018-08-02 MED ORDER — OLMESARTAN MEDOXOMIL 40 MG PO TABS: 40.0000 mg | ORAL_TABLET | Freq: Every day | ORAL | 3 refills | Status: DC

## 2018-08-31 ENCOUNTER — Other Ambulatory Visit: Payer: PPO | Admitting: Internal Medicine

## 2018-08-31 DIAGNOSIS — E119 Type 2 diabetes mellitus without complications: Secondary | ICD-10-CM | POA: Diagnosis not present

## 2018-08-31 DIAGNOSIS — E785 Hyperlipidemia, unspecified: Secondary | ICD-10-CM | POA: Diagnosis not present

## 2018-09-01 LAB — LIPID PANEL
Cholesterol: 189 mg/dL (ref <200)
HDL: 39 mg/dL — ABNORMAL LOW (ref >=40)
LDL Cholesterol (Calc): 128 mg/dL (calc) — ABNORMAL HIGH
Non-HDL Cholesterol (Calc): 150 mg/dL (calc) — ABNORMAL HIGH (ref <130)
Total CHOL/HDL Ratio: 4.8 (calc) (ref <5.0)
Triglycerides: 110 mg/dL (ref <150)

## 2018-09-01 LAB — HEPATIC FUNCTION PANEL
AG Ratio: 2 (calc) (ref 1.0–2.5)
ALT: 19 U/L (ref 9–46)
AST: 18 U/L (ref 10–35)
Albumin: 4.4 g/dL (ref 3.6–5.1)
Alkaline phosphatase (APISO): 26 U/L — ABNORMAL LOW (ref 35–144)
Bilirubin, Direct: 0.1 mg/dL (ref 0.0–0.2)
Globulin: 2.2 g/dL (calc) (ref 1.9–3.7)
Indirect Bilirubin: 0.4 mg/dL (calc) (ref 0.2–1.2)
Total Bilirubin: 0.5 mg/dL (ref 0.2–1.2)
Total Protein: 6.6 g/dL (ref 6.1–8.1)

## 2018-09-01 LAB — MICROALBUMIN / CREATININE URINE RATIO
Creatinine, Urine: 96 mg/dL (ref 20–320)
Microalb Creat Ratio: 6 mcg/mg creat (ref <30)
Microalb, Ur: 0.6 mg/dL

## 2018-09-01 LAB — HEMOGLOBIN A1C
Hgb A1c MFr Bld: 6 % of total Hgb — ABNORMAL HIGH (ref <5.7)
Mean Plasma Glucose: 126 (calc)
eAG (mmol/L): 7 (calc)

## 2018-09-03 ENCOUNTER — Ambulatory Visit (INDEPENDENT_AMBULATORY_CARE_PROVIDER_SITE_OTHER): Payer: PPO | Admitting: Internal Medicine

## 2018-09-03 ENCOUNTER — Encounter: Payer: Self-pay | Admitting: Internal Medicine

## 2018-09-03 VITALS — BP 140/80 | HR 83 | Ht 72.0 in | Wt 248.0 lb

## 2018-09-03 DIAGNOSIS — I10 Essential (primary) hypertension: Secondary | ICD-10-CM | POA: Diagnosis not present

## 2018-09-03 DIAGNOSIS — Z6833 Body mass index (BMI) 33.0-33.9, adult: Secondary | ICD-10-CM

## 2018-09-03 DIAGNOSIS — E1169 Type 2 diabetes mellitus with other specified complication: Secondary | ICD-10-CM | POA: Diagnosis not present

## 2018-09-03 DIAGNOSIS — E785 Hyperlipidemia, unspecified: Secondary | ICD-10-CM

## 2018-09-03 NOTE — Progress Notes (Signed)
   Subjective:    Patient ID: [name] [name], male    DOB: 17-Mar-1951, 68 y.o.   MRN: [id number]  HPI   68 year old Male for 6 month  follow up.  He has hypertension, diabetes mellitus, hyperlipidemia, obesity, metabolic syndrome.  Feels well with no new complaints.  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 Due for Pneumovax 23.  We need to order that.  May be due for colonoscopy.  Needs to check with gastroenterologist.     Review of Systems see above-has some musculoskeletal pain back and neck but much improved     Objective:   Physical Exam Vital signs reviewed.  Skin warm and dry.  Nodes none.  Neck is supple.  No carotid bruits.  Chest clear to auscultation without rales or wheezing.  Cardiac exam regular rate and rhythm normal S1 and S2.  No lower extremity edema. Would like for him to monitor blood pressure at home.  It is 140/80.  Weight is 248 pounds and BMI is 33.63.      Assessment & Plan:  Needs to lose weight Continue current meds and Return for CPE in 6 months Have colonoscopy-hx adenomatous polyps Labs reviewed and stable-see below.  Would benefit from diet exercise and weight loss.  Essential hypertension-continue to monitor at home  Hyperlipidemia-elevated LDL of 128.  Watch diet.  Continue statin medication.  Impaired glucose tolerance-hemoglobin A1c stable at 6%.

## 2018-09-12 NOTE — Patient Instructions (Signed)
It was a pleasure to see you today.  Please watch her blood pressure at home.  Watch diet and exercise.  Try to lose weight.  Follow-up in 6 months.  Need to have colonoscopy and return in the next couple of weeks for pneumococcal 23 vaccine.

## 2018-10-19 ENCOUNTER — Ambulatory Visit (INDEPENDENT_AMBULATORY_CARE_PROVIDER_SITE_OTHER): Payer: PPO | Admitting: Internal Medicine

## 2018-10-19 ENCOUNTER — Other Ambulatory Visit: Payer: Self-pay

## 2018-10-19 DIAGNOSIS — E119 Type 2 diabetes mellitus without complications: Secondary | ICD-10-CM

## 2018-10-19 DIAGNOSIS — L298 Other pruritus: Secondary | ICD-10-CM

## 2018-10-19 MED ORDER — TRIAMCINOLONE ACETONIDE 0.1 % EX CREA: 1.0000 "application " | TOPICAL_CREAM | Freq: Three times a day (TID) | CUTANEOUS | 1 refills | Status: DC

## 2018-10-19 NOTE — Progress Notes (Signed)
   Subjective:    Patient ID: [name] [name], male    DOB: 1951/03/23, 68 y.o.   MRN: [id number]  HPI 68 year old Male seen today by [employer] due to the coronavirus pandemic.  He is complaining of a rash on his foot and leg that is very itchy.  He has a history of diabetes that is well controlled.  He was last seen here in February and had an hemoglobin A1c of 6%.  He is identified as [name] [name] using 2 identifiers.  He is a patient in this practice.  He gives consent to this visit in this format today.  Since he has tried various over-the-counter medications for this rash without relief.    Review of Systems see above     Objective:   Physical Exam  Dorsum of foot and anterior ankle shows erythema.  Cannot identify any papular lesions in this video or excoriations.      Assessment & Plan:  Nonspecific pruritic rash dorsum of foot and ankle-?  Eczema  Plan: Triamcinolone cream 0.1%.  Apply to rash 3 times a day.  Call if not better in 7 to 10 days or sooner if worse.  His rash seems to be limited to a particular part of the body and does not represent a generalized process.

## 2018-11-08 ENCOUNTER — Encounter: Payer: Self-pay | Admitting: Internal Medicine

## 2018-11-08 NOTE — Patient Instructions (Signed)
Apply triamcinolone cream to rash 3 times a day.  Call if not better in 7 to 10 days or sooner if worse.

## 2018-12-06 ENCOUNTER — Other Ambulatory Visit: Payer: Self-pay | Admitting: Internal Medicine

## 2019-01-18 ENCOUNTER — Other Ambulatory Visit: Payer: PPO | Admitting: Internal Medicine

## 2019-01-18 ENCOUNTER — Other Ambulatory Visit: Payer: Self-pay

## 2019-01-18 DIAGNOSIS — I10 Essential (primary) hypertension: Secondary | ICD-10-CM | POA: Diagnosis not present

## 2019-01-18 DIAGNOSIS — E1169 Type 2 diabetes mellitus with other specified complication: Secondary | ICD-10-CM

## 2019-01-18 DIAGNOSIS — E785 Hyperlipidemia, unspecified: Secondary | ICD-10-CM

## 2019-01-18 DIAGNOSIS — N529 Male erectile dysfunction, unspecified: Secondary | ICD-10-CM

## 2019-01-18 DIAGNOSIS — E119 Type 2 diabetes mellitus without complications: Secondary | ICD-10-CM

## 2019-01-18 DIAGNOSIS — M5412 Radiculopathy, cervical region: Secondary | ICD-10-CM

## 2019-01-19 LAB — HEMOGLOBIN A1C
Hgb A1c MFr Bld: 6 % of total Hgb — ABNORMAL HIGH (ref <5.7)
Mean Plasma Glucose: 126 (calc)
eAG (mmol/L): 7 (calc)

## 2019-01-19 LAB — COMPLETE METABOLIC PANEL WITH GFR
AG Ratio: 2.2 (calc) (ref 1.0–2.5)
ALT: 22 U/L (ref 9–46)
AST: 22 U/L (ref 10–35)
Albumin: 4.6 g/dL (ref 3.6–5.1)
Alkaline phosphatase (APISO): 23 U/L — ABNORMAL LOW (ref 35–144)
BUN: 11 mg/dL (ref 7–25)
CO2: 27 mmol/L (ref 20–32)
Calcium: 9.9 mg/dL (ref 8.6–10.3)
Chloride: 96 mmol/L — ABNORMAL LOW (ref 98–110)
Creat: 0.87 mg/dL (ref 0.70–1.25)
GFR, Est African American: 104 mL/min/{1.73_m2} (ref >=60)
GFR, Est Non African American: 89 mL/min/{1.73_m2} (ref >=60)
Globulin: 2.1 g/dL (calc) (ref 1.9–3.7)
Glucose, Bld: 113 mg/dL — ABNORMAL HIGH (ref 65–99)
Potassium: 4.7 mmol/L (ref 3.5–5.3)
Sodium: 132 mmol/L — ABNORMAL LOW (ref 135–146)
Total Bilirubin: 0.7 mg/dL (ref 0.2–1.2)
Total Protein: 6.7 g/dL (ref 6.1–8.1)

## 2019-01-19 LAB — CBC WITH DIFFERENTIAL/PLATELET
Absolute Monocytes: 487 cells/uL (ref 200–950)
Basophils Absolute: 62 cells/uL (ref 0–200)
Basophils Relative: 1.1 %
Eosinophils Absolute: 129 cells/uL (ref 15–500)
Eosinophils Relative: 2.3 %
HCT: 41.4 % (ref 38.5–50.0)
Hemoglobin: 13.9 g/dL (ref 13.2–17.1)
Lymphs Abs: 1786 cells/uL (ref 850–3900)
MCH: 30.7 pg (ref 27.0–33.0)
MCHC: 33.6 g/dL (ref 32.0–36.0)
MCV: 91.4 fL (ref 80.0–100.0)
MPV: 11 fL (ref 7.5–12.5)
Monocytes Relative: 8.7 %
Neutro Abs: 3136 cells/uL (ref 1500–7800)
Neutrophils Relative %: 56 %
Platelets: 225 10*3/uL (ref 140–400)
RBC: 4.53 10*6/uL (ref 4.20–5.80)
RDW: 12.9 % (ref 11.0–15.0)
Total Lymphocyte: 31.9 %
WBC: 5.6 10*3/uL (ref 3.8–10.8)

## 2019-01-19 LAB — LIPID PANEL
Cholesterol: 172 mg/dL (ref <200)
HDL: 45 mg/dL (ref >=40)
LDL Cholesterol (Calc): 107 mg/dL (calc) — ABNORMAL HIGH
Non-HDL Cholesterol (Calc): 127 mg/dL (calc) (ref <130)
Total CHOL/HDL Ratio: 3.8 (calc) (ref <5.0)
Triglycerides: 100 mg/dL (ref <150)

## 2019-01-19 LAB — MICROALBUMIN / CREATININE URINE RATIO
Creatinine, Urine: 67 mg/dL (ref 20–320)
Microalb Creat Ratio: 6 mcg/mg creat (ref <30)
Microalb, Ur: 0.4 mg/dL

## 2019-01-19 LAB — PSA: PSA: 3.6 ng/mL (ref <=4.0)

## 2019-01-21 ENCOUNTER — Ambulatory Visit (INDEPENDENT_AMBULATORY_CARE_PROVIDER_SITE_OTHER): Payer: PPO | Admitting: Internal Medicine

## 2019-01-21 ENCOUNTER — Encounter: Payer: Self-pay | Admitting: Internal Medicine

## 2019-01-21 ENCOUNTER — Other Ambulatory Visit: Payer: Self-pay

## 2019-01-21 VITALS — BP 140/80 | HR 73 | Ht 72.0 in | Wt 246.0 lb

## 2019-01-21 DIAGNOSIS — E781 Pure hyperglyceridemia: Secondary | ICD-10-CM

## 2019-01-21 DIAGNOSIS — E785 Hyperlipidemia, unspecified: Secondary | ICD-10-CM

## 2019-01-21 DIAGNOSIS — E1169 Type 2 diabetes mellitus with other specified complication: Secondary | ICD-10-CM | POA: Diagnosis not present

## 2019-01-21 DIAGNOSIS — E8881 Metabolic syndrome: Secondary | ICD-10-CM

## 2019-01-21 DIAGNOSIS — Z6833 Body mass index (BMI) 33.0-33.9, adult: Secondary | ICD-10-CM | POA: Diagnosis not present

## 2019-01-21 DIAGNOSIS — I10 Essential (primary) hypertension: Secondary | ICD-10-CM | POA: Diagnosis not present

## 2019-01-21 DIAGNOSIS — Z794 Long term (current) use of insulin: Secondary | ICD-10-CM

## 2019-01-21 DIAGNOSIS — E119 Type 2 diabetes mellitus without complications: Secondary | ICD-10-CM | POA: Diagnosis not present

## 2019-01-21 DIAGNOSIS — IMO0001 Reserved for inherently not codable concepts without codable children: Secondary | ICD-10-CM

## 2019-01-21 DIAGNOSIS — Z85828 Personal history of other malignant neoplasm of skin: Secondary | ICD-10-CM | POA: Diagnosis not present

## 2019-01-21 DIAGNOSIS — L821 Other seborrheic keratosis: Secondary | ICD-10-CM | POA: Diagnosis not present

## 2019-01-21 DIAGNOSIS — Z8659 Personal history of other mental and behavioral disorders: Secondary | ICD-10-CM | POA: Diagnosis not present

## 2019-01-21 DIAGNOSIS — Z0000 Encounter for general adult medical examination without abnormal findings: Secondary | ICD-10-CM | POA: Diagnosis not present

## 2019-01-21 DIAGNOSIS — D225 Melanocytic nevi of trunk: Secondary | ICD-10-CM | POA: Diagnosis not present

## 2019-01-21 DIAGNOSIS — L570 Actinic keratosis: Secondary | ICD-10-CM | POA: Diagnosis not present

## 2019-01-21 LAB — POCT URINALYSIS DIPSTICK
Appearance: NEGATIVE
Bilirubin, UA: NEGATIVE
Blood, UA: NEGATIVE
Glucose, UA: NEGATIVE
Ketones, UA: NEGATIVE
Leukocytes, UA: NEGATIVE
Nitrite, UA: NEGATIVE
Odor: NEGATIVE
Protein, UA: NEGATIVE
Spec Grav, UA: 1.01 (ref 1.010–1.025)
Urobilinogen, UA: 0.2 E.U./dL
pH, UA: 7 (ref 5.0–8.0)

## 2019-01-21 NOTE — Progress Notes (Signed)
Subjective:    Patient ID: [name] [name], male    DOB: 10-Nov-1950, 68 y.o.   MRN: [id number]  HPI 68 year old Male for health maintenance exam, Medicare wellness annual visit and evaluation of medical issues.  History of hypertension, diabetes mellitus, hyperlipidemia, obesity, metabolic syndrome.  History of erectile dysfunction and gout.  History of back pain status post L2-L3 discectomy March 2014 for right lower extremity radiculopathy and back pain.  History of BPH.  Colonoscopy done 2010 with history of adenomatous colon polyps.  Needs repeat study.  Cardiolite study 2004 was negative.  Pneumovax and Prevnar immunizations up-to-date.  Reminded about annual diabetic eye exam.  History of degenerative arthritis and AC joint of right shoulder in 2007 seen by Dr. Daylene Katayama.  In March 1999 he had a partial tear of gastrocnemius muscle in the left leg treated by Dr. Sharol Given.  He had a cyst removed from his spine in 1977 and it may actually have been a pilonidal cyst.  History of lumbar disc disease L4-L5 and L5-S1 in 1998.  He presented with lumbar radiculopathy at the time.  He received epidural steroids x3 with improvement.  Remote history of depression treated with antidepressants and stable.  Currently not depressed.  Tetanus immunization given 2014.  Recommend annual flu vaccine  Social history: He is married.  First wife died of cancer.  This is his second marriage.  Completed 1 year of college.  Enjoys golf.  Does not smoke.  Drinks beer.  2 adult children, a son and a daughter.  He is a self-employed Hydrologist.  Family history: Mother died of stomach cancer at age 42.  Father died of lung cancer at age 66.  He apparently died within 6 months of each other when he was 87 or 67 years old..  3 sisters in good health.  Labs reviewed with hemoglobin A1c 6%, CBC is normal, fasting glucose 113.  Liver functions are normal.  LDL cholesterol 107.  Remainder of lipid panel  normal.  PSA 3.6.      Review of Systems  Constitutional: Negative.   Respiratory: Negative.   Cardiovascular: Negative.   Gastrointestinal: Negative.   Neurological: Negative.   Psychiatric/Behavioral: Negative.        Objective:   Physical Exam Vitals signs reviewed.  Constitutional:      General: He is not in acute distress.    Appearance: Normal appearance. He is obese.  HENT:     Head: Normocephalic and atraumatic.     Right Ear: Tympanic membrane normal.     Left Ear: Tympanic membrane normal.     Nose: Nose normal.     Mouth/Throat:     Mouth: Mucous membranes are moist.     Pharynx: Oropharynx is clear.  Eyes:     General: No scleral icterus.       Right eye: No discharge.        Left eye: No discharge.     Extraocular Movements: Extraocular movements intact.     Conjunctiva/sclera: Conjunctivae normal.     Pupils: Pupils are equal, round, and reactive to light.  Neck:     Musculoskeletal: Neck supple.     Vascular: No carotid bruit.  Cardiovascular:     Rate and Rhythm: Normal rate and regular rhythm.     Pulses: Normal pulses.     Heart sounds: Normal heart sounds. No murmur.  Pulmonary:     Effort: Pulmonary effort is normal.     Breath  sounds: Normal breath sounds. No wheezing or rales.  Abdominal:     General: Bowel sounds are normal.     Palpations: Abdomen is soft. There is no mass.     Tenderness: There is no abdominal tenderness. There is no guarding.  Genitourinary:    Prostate: Normal.  Musculoskeletal:     Right lower leg: No edema.     Left lower leg: No edema.  Lymphadenopathy:     Cervical: No cervical adenopathy.  Skin:    General: Skin is warm and dry.     Findings: No rash.  Neurological:     General: No focal deficit present.     Mental Status: He is alert and oriented to person, place, and time.     Cranial Nerves: No cranial nerve deficit.     Sensory: No sensory deficit.     Coordination: Coordination normal.     Gait:  Gait normal.  Psychiatric:        Mood and Affect: Mood normal.        Behavior: Behavior normal.        Thought Content: Thought content normal.        Judgment: Judgment normal.           Assessment & Plan:  Essential hypertension stable at 140/80-would benefit from weight loss.  Continue same medication.  Hyperlipidemia-stable  Diabetes mellitus-excellent control with 6%  BMI 33-benefit discussed regarding diet exercise and weight loss.  Metabolic syndrome  BPH treated with Flomax  History of erectile dysfunction  History of depression treated with Wellbutrin and stable  Try to work on diet exercise and weight loss and follow-up in 6 months.  Subjective:   Patient presents for Medicare Annual/Subsequent preventive examination.  Review Past Medical/Family/Social: See above   Risk Factors  Current exercise habits: Plays golf and works as a Hydrologist Dietary issues discussed: Low-fat low carbohydrate  Cardiac risk factors: Diabetes mellitus and hypertension  Depression Screen  (Note: if answer to either of the following is "Yes", a more complete depression screening is indicated)   Over the past two weeks, have you felt down, depressed or hopeless? No  Over the past two weeks, have you felt little interest or pleasure in doing things? No Have you lost interest or pleasure in daily life? No Do you often feel hopeless? No Do you cry easily over simple problems? No   Activities of Daily Living  In your present state of health, do you have any difficulty performing the following activities?:   Driving? No  Managing money? No  Feeding yourself? No  Getting from bed to chair? No  Climbing a flight of stairs? No  Preparing food and eating?: No  Bathing or showering? No  Getting dressed: No  Getting to the toilet? No  Using the toilet:No  Moving around from place to place: No  In the past year have you fallen or had a near fall?:No  Are you  sexually active? No  Do you have more than one partner? No   Hearing Difficulties: No  Do you often ask people to speak up or repeat themselves? No  Do you experience ringing or noises in your ears? No  Do you have difficulty understanding soft or whispered voices? No  Do you feel that you have a problem with memory?  A little sometimes Do you often misplace items? No    Home Safety:  Do you have a smoke alarm at your residence?  None  Do you have grab bars in the bathroom?  None Do you have throw rugs in your house?  None   Cognitive Testing  Alert? Yes Normal Appearance?Yes  Oriented to person? Yes Place? Yes  Time? Yes  Recall of three objects? Yes  Can perform simple calculations? Yes  Displays appropriate judgment?Yes  Can read the correct time from a watch face?Yes   List the Names of Other Physician/Practitioners you currently use:  See referral list for the physicians patient is currently seeing.     Review of Systems: See above   Objective:     General appearance: Appears stated age and mildly obese  Head: Normocephalic, without obvious abnormality, atraumatic  Eyes: conj clear, EOMi PEERLA  Ears: normal TM's and external ear canals both ears  Nose: Nares normal. Septum midline. Mucosa normal. No drainage or sinus tenderness.  Throat: lips, mucosa, and tongue normal; teeth and gums normal  Neck: no adenopathy, no carotid bruit, no JVD, supple, symmetrical, trachea midline and thyroid not enlarged, symmetric, no tenderness/mass/nodules  No CVA tenderness.  Lungs: clear to auscultation bilaterally  Breasts: normal appearance, no masses or tenderness Heart: regular rate and rhythm, S1, S2 normal, no murmur, click, rub or gallop  Abdomen: soft, non-tender; bowel sounds normal; no masses, no organomegaly  Musculoskeletal: ROM normal in all joints, no crepitus, no deformity, Normal muscle strengthen. Back  is symmetric, no curvature. Skin: Skin color, texture,  turgor normal. No rashes or lesions  Lymph nodes: Cervical, supraclavicular, and axillary nodes normal.  Neurologic: CN 2 -12 Normal, Normal symmetric reflexes. Normal coordination and gait  Psych: Alert & Oriented x 3, Mood appear stable.    Assessment:    Annual wellness medicare exam   Plan:    During the course of the visit the patient was educated and counseled about appropriate screening and preventive services including:   Reminded about annual flu vaccine     Patient Instructions (the written plan) was given to the patient.  Medicare Attestation  I have personally reviewed:  The patient's medical and social history  Their use of alcohol, tobacco or illicit drugs  Their current medications and supplements  The patient's functional ability including ADLs,fall risks, home safety risks, cognitive, and hearing and visual impairment  Diet and physical activities  Evidence for depression or mood disorders  The patient's weight, height, BMI, and visual acuity have been recorded in the chart. I have made referrals, counseling, and provided education to the patient based on review of the above and I have provided the patient with a written personalized care plan for preventive services.

## 2019-02-07 ENCOUNTER — Encounter: Payer: Self-pay | Admitting: Internal Medicine

## 2019-02-07 DIAGNOSIS — E781 Pure hyperglyceridemia: Secondary | ICD-10-CM | POA: Insufficient documentation

## 2019-02-07 DIAGNOSIS — IMO0001 Reserved for inherently not codable concepts without codable children: Secondary | ICD-10-CM | POA: Insufficient documentation

## 2019-02-07 NOTE — Patient Instructions (Signed)
It was a pleasure to see you today.  Please work on diet exercise and weight loss and follow-up in 6 months.  Continue same medications.

## 2019-03-06 ENCOUNTER — Other Ambulatory Visit: Payer: Self-pay | Admitting: Internal Medicine

## 2019-04-19 ENCOUNTER — Other Ambulatory Visit: Payer: Self-pay | Admitting: Internal Medicine

## 2019-04-24 ENCOUNTER — Other Ambulatory Visit: Payer: Self-pay | Admitting: Internal Medicine

## 2019-04-26 ENCOUNTER — Telehealth: Payer: Self-pay | Admitting: Internal Medicine

## 2019-04-26 MED ORDER — LANTUS SOLOSTAR 100 UNIT/ML ~~LOC~~ SOPN: 22.0000 [IU] | PEN_INJECTOR | Freq: Every day | SUBCUTANEOUS | 99 refills | Status: DC

## 2019-04-26 NOTE — Telephone Encounter (Signed)
Received Fax RX request from  Pharmacy - [facility]  Medication - Insulin Glargine (LANTUS SOLOSTAR) 100 UNIT/ML Solostar Pen   Last Refill - 12/03/18  Last OV - 01/21/19  Last CPE - 01/21/19

## 2019-05-10 ENCOUNTER — Other Ambulatory Visit: Payer: Self-pay

## 2019-05-10 MED ORDER — ROSUVASTATIN CALCIUM 5 MG PO TABS: ORAL_TABLET | ORAL | 3 refills | Status: DC

## 2019-05-12 ENCOUNTER — Telehealth: Payer: Self-pay | Admitting: Internal Medicine

## 2019-05-12 NOTE — Telephone Encounter (Signed)
[facility] pharmacy called to say that per Medicare rules that lancets and strips have to have a number of refills can not be PRN / Will you send new prescription to them?

## 2019-06-03 ENCOUNTER — Other Ambulatory Visit: Payer: Self-pay | Admitting: Internal Medicine

## 2019-06-30 ENCOUNTER — Telehealth: Payer: Self-pay | Admitting: Internal Medicine

## 2019-06-30 NOTE — Telephone Encounter (Signed)
Working Building surveyor from insurance for patient to start taken satins medications and patient started rosuvastatin (CRESTOR) 5 MG tablet   On 05/10/19

## 2019-07-13 ENCOUNTER — Other Ambulatory Visit: Payer: Self-pay | Admitting: Internal Medicine

## 2019-07-15 ENCOUNTER — Other Ambulatory Visit: Payer: Self-pay | Admitting: Internal Medicine

## 2019-07-15 LAB — HM DIABETES EYE EXAM

## 2019-07-19 ENCOUNTER — Encounter: Payer: Self-pay | Admitting: Internal Medicine

## 2019-07-22 ENCOUNTER — Other Ambulatory Visit: Payer: PPO | Admitting: Internal Medicine

## 2019-07-26 ENCOUNTER — Ambulatory Visit: Payer: PPO | Admitting: Internal Medicine

## 2019-07-30 ENCOUNTER — Other Ambulatory Visit: Payer: Self-pay

## 2019-07-30 ENCOUNTER — Other Ambulatory Visit: Payer: PPO | Admitting: Internal Medicine

## 2019-07-30 DIAGNOSIS — E119 Type 2 diabetes mellitus without complications: Secondary | ICD-10-CM | POA: Diagnosis not present

## 2019-07-30 DIAGNOSIS — E785 Hyperlipidemia, unspecified: Secondary | ICD-10-CM | POA: Diagnosis not present

## 2019-07-30 DIAGNOSIS — E1169 Type 2 diabetes mellitus with other specified complication: Secondary | ICD-10-CM | POA: Diagnosis not present

## 2019-07-31 LAB — LIPID PANEL
Cholesterol: 178 mg/dL (ref <200)
HDL: 43 mg/dL (ref >=40)
LDL Cholesterol (Calc): 115 mg/dL (calc) — ABNORMAL HIGH
Non-HDL Cholesterol (Calc): 135 mg/dL (calc) — ABNORMAL HIGH (ref <130)
Total CHOL/HDL Ratio: 4.1 (calc) (ref <5.0)
Triglycerides: 98 mg/dL (ref <150)

## 2019-07-31 LAB — HEPATIC FUNCTION PANEL
AG Ratio: 2 (calc) (ref 1.0–2.5)
ALT: 24 U/L (ref 9–46)
AST: 24 U/L (ref 10–35)
Albumin: 4.7 g/dL (ref 3.6–5.1)
Alkaline phosphatase (APISO): 24 U/L — ABNORMAL LOW (ref 35–144)
Bilirubin, Direct: 0.2 mg/dL (ref 0.0–0.2)
Globulin: 2.3 g/dL (calc) (ref 1.9–3.7)
Indirect Bilirubin: 0.5 mg/dL (calc) (ref 0.2–1.2)
Total Bilirubin: 0.7 mg/dL (ref 0.2–1.2)
Total Protein: 7 g/dL (ref 6.1–8.1)

## 2019-07-31 LAB — HEMOGLOBIN A1C
Hgb A1c MFr Bld: 6.1 % of total Hgb — ABNORMAL HIGH (ref <5.7)
Mean Plasma Glucose: 128 (calc)
eAG (mmol/L): 7.1 (calc)

## 2019-08-03 ENCOUNTER — Other Ambulatory Visit: Payer: Self-pay

## 2019-08-03 ENCOUNTER — Encounter: Payer: Self-pay | Admitting: Internal Medicine

## 2019-08-03 ENCOUNTER — Ambulatory Visit (INDEPENDENT_AMBULATORY_CARE_PROVIDER_SITE_OTHER): Payer: PPO | Admitting: Internal Medicine

## 2019-08-03 VITALS — BP 120/68 | HR 68 | Temp 98.0°F | Ht 72.0 in | Wt 250.0 lb

## 2019-08-03 DIAGNOSIS — E8881 Metabolic syndrome: Secondary | ICD-10-CM | POA: Diagnosis not present

## 2019-08-03 DIAGNOSIS — Z23 Encounter for immunization: Secondary | ICD-10-CM

## 2019-08-03 DIAGNOSIS — Z8659 Personal history of other mental and behavioral disorders: Secondary | ICD-10-CM | POA: Diagnosis not present

## 2019-08-03 DIAGNOSIS — I10 Essential (primary) hypertension: Secondary | ICD-10-CM

## 2019-08-03 DIAGNOSIS — Z6833 Body mass index (BMI) 33.0-33.9, adult: Secondary | ICD-10-CM | POA: Diagnosis not present

## 2019-08-03 DIAGNOSIS — E785 Hyperlipidemia, unspecified: Secondary | ICD-10-CM

## 2019-08-03 DIAGNOSIS — E1169 Type 2 diabetes mellitus with other specified complication: Secondary | ICD-10-CM | POA: Diagnosis not present

## 2019-08-28 ENCOUNTER — Ambulatory Visit: Payer: PPO | Attending: Internal Medicine

## 2019-08-28 DIAGNOSIS — Z23 Encounter for immunization: Secondary | ICD-10-CM | POA: Insufficient documentation

## 2019-08-28 NOTE — Progress Notes (Signed)
   Covid-19 Vaccination Clinic  Name:  [name] [name]    MRN: [id number] DOB: 1951/05/13  08/28/2019  Mr. Nong was observed post Covid-19 immunization for 15 minutes without incidence. He was provided with Vaccine Information Sheet and instruction to access the V-Safe system.   Mr. Spangler was instructed to call 911 with any severe reactions post vaccine: Marland Kitchen Difficulty breathing  . Swelling of your face and throat  . A fast heartbeat  . A bad rash all over your body  . Dizziness and weakness    Immunizations Administered    Name Date Dose VIS Date Route   Pfizer COVID-19 Vaccine 08/28/2019 10:41 AM 0.3 mL 06/25/2019 Intramuscular   Manufacturer: [place]   Lot: [id number]   [place]: [id number]

## 2019-09-07 NOTE — Patient Instructions (Signed)
Continue current medications and follow-up in 6 months.  Try to lose some weight.  It was a pleasure to see you today.

## 2019-09-07 NOTE — Progress Notes (Signed)
   Subjective:    Patient ID: [name] [name], male    DOB: 03-28-1951, 69 y.o.   MRN: [id number]  HPI 69 year old Male for 69-month follow-up on hypertension, diabetes mellitus, hyperlipidemia, obesity, metabolic syndrome.  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  No new complaints.  Feels well.  History of BPH treated with Flomax.  Is on Crestor 5 mg once a week.  Is on fenofibrate 160 mg daily.  Is on Glucophage and Januvia for diabetes mellitus.  Takes Benicar 40 mg daily for hypertension, amlodipine 5 mg daily and HCTZ 25 mg daily.  Hemoglobin A1c 6.1% and stable. Liver functions are normal.  Lipid panel normal with the exception of an LDL mildly elevated at 115.   Review of Systems no new complaints.     Objective:   Physical Exam Blood pressure 120/68, pulse 68, temperature 98 degrees, weight 250 pounds.  BMI 33.91.  Skin warm and dry.   Neck is supple without JVD thyromegaly or carotid bruits.  Chest is clear to auscultation without rales or wheezing.  Cardiac exam regular rate and rhythm normal S1 and S2 without murmurs or gallops.  No lower extremity edema.       Assessment & Plan:  Essential hypertension-stable on current regimen and under excellent control  BPH treated with Flomax and stable  Diabetes mellitus-stable hemoglobin A1c at 6.1% continue current regimen  BMI 33.91.  Needs to lose weight.  Hyperlipidemia stable on Crestor and fenofibrate.  History of depression treated with Wellbutrin and stable  Plan: I am pleased with his labs.  Would like to see him lose some weight.  Return in 6 months or as needed.  Flu vaccine given

## 2019-09-20 ENCOUNTER — Ambulatory Visit: Payer: PPO | Attending: Internal Medicine

## 2019-09-20 DIAGNOSIS — Z23 Encounter for immunization: Secondary | ICD-10-CM | POA: Insufficient documentation

## 2019-09-20 NOTE — Progress Notes (Signed)
   Covid-19 Vaccination Clinic  Name:  [name] [name]    MRN: [id number] DOB: [birth year]  09/20/2019  [name] was observed post Covid-19 immunization for 15 minutes without incident. He was provided with Vaccine Information Sheet and instruction to access the V-Safe system.   [name] was instructed to call 911 with any severe reactions post vaccine: Marland Kitchen Difficulty breathing  . Swelling of face and throat  . A fast heartbeat  . A bad rash all over body  . Dizziness and weakness   Immunizations Administered    Name Date Dose VIS Date Route   Pfizer COVID-19 Vaccine 09/20/2019 10:43 AM 0.3 mL 06/25/2019 Intramuscular   Manufacturer: [place]   Lot: [id number]   [place]: [id number]

## 2019-10-06 ENCOUNTER — Other Ambulatory Visit: Payer: Self-pay

## 2019-10-06 MED ORDER — CLONAZEPAM 1 MG PO TABS: 1.0000 mg | ORAL_TABLET | Freq: Two times a day (BID) | ORAL | 0 refills | Status: DC | PRN

## 2019-11-26 ENCOUNTER — Other Ambulatory Visit: Payer: Self-pay | Admitting: Internal Medicine

## 2019-12-14 ENCOUNTER — Ambulatory Visit (INDEPENDENT_AMBULATORY_CARE_PROVIDER_SITE_OTHER): Payer: PPO | Admitting: Internal Medicine

## 2019-12-14 ENCOUNTER — Other Ambulatory Visit: Payer: Self-pay

## 2019-12-14 ENCOUNTER — Telehealth: Payer: Self-pay

## 2019-12-14 ENCOUNTER — Encounter: Payer: Self-pay | Admitting: Internal Medicine

## 2019-12-14 VITALS — BP 140/80 | HR 78 | Ht 72.0 in | Wt 244.0 lb

## 2019-12-14 DIAGNOSIS — Z8659 Personal history of other mental and behavioral disorders: Secondary | ICD-10-CM

## 2019-12-14 DIAGNOSIS — I10 Essential (primary) hypertension: Secondary | ICD-10-CM | POA: Diagnosis not present

## 2019-12-14 DIAGNOSIS — F419 Anxiety disorder, unspecified: Secondary | ICD-10-CM | POA: Diagnosis not present

## 2019-12-14 DIAGNOSIS — F439 Reaction to severe stress, unspecified: Secondary | ICD-10-CM

## 2019-12-14 DIAGNOSIS — E785 Hyperlipidemia, unspecified: Secondary | ICD-10-CM

## 2019-12-14 DIAGNOSIS — Z6833 Body mass index (BMI) 33.0-33.9, adult: Secondary | ICD-10-CM

## 2019-12-14 DIAGNOSIS — R079 Chest pain, unspecified: Secondary | ICD-10-CM | POA: Diagnosis not present

## 2019-12-14 DIAGNOSIS — E1169 Type 2 diabetes mellitus with other specified complication: Secondary | ICD-10-CM

## 2019-12-14 DIAGNOSIS — E8881 Metabolic syndrome: Secondary | ICD-10-CM | POA: Diagnosis not present

## 2019-12-14 MED ORDER — CLONAZEPAM 1 MG PO TABS: 1.0000 mg | ORAL_TABLET | Freq: Two times a day (BID) | ORAL | 0 refills | Status: DC | PRN

## 2019-12-14 NOTE — Telephone Encounter (Signed)
He said he only had one episode of cp on Saturday, no diaphoresis, nausea. He thinks it was an anxiety attack, he said he ran out of the [facility]

## 2019-12-14 NOTE — Telephone Encounter (Signed)
Patient called and states that he is having tightness in his chest, SOB, and was wanting to know if he needs to come in for an appointment.  Please call him back at [phone]

## 2019-12-14 NOTE — Telephone Encounter (Signed)
Please call him and see how long this has been going on. Is this new today or been going on a while? Is he having diaphoresis, severe  pain,nausea-if so needs to go to ED- otherwise see today

## 2020-01-09 NOTE — Progress Notes (Signed)
   Subjective:    Patient ID: [name] [name], male    DOB: Nov 07, 1950, 69 y.o.   MRN: [id number]  HPI 69 year old Male with history of insulin-dependent diabetes, HTN, obesity, metabolic syndrome, and hyperlipidemia presents acutely with situational stress and chest discomfort.  Patient is on Lantus 22 units subcutaneously daily, Januvia, Metformin and last hemoglobin A1c in January was 6.1%  Lipids were normal in January with the exception of a mild LDL of 115. He is on statin medication. He is compliant with his medications.  He is on Wellbutrin for situational stress and depression.  He has a daughter who has history of drug abuse. At one point she disappeared a few weeks ago and it was very distressing to him. He has tried to help her many times. This is a daughter from a previous marriage. She recently started a job in been called to say she had a flat tire. He has tried to disentangle himself from her situations because they always turn out badly but he is very upset today. He realizes that he needs to cut ties with his daughter. Current wife has been very tolerant. They both realize that should not take his daughter into their home.  Spoke with him about the situation for some 15 to 20 minutes.  He is also having some substernal chest pain without radiation to neck or down the arm. EKG performed today shows no acute changes. I feel that this is likely stress related but he needs to have a cardiac evaluation in the future. Referral will be made.   Review of Systems see above     Objective:   Physical Exam Blood pressure 140/80 pulse 78 , he is afebrile. Pulse is 78 and regular. Weight 244 pounds. BMI 33.89  Skin warm and dry. Nodes none. Neck is supple without JVD thyromegaly or carotid bruits. Chest is clear to auscultation. Cardiac exam regular rate and rhythm normal S1 and S2 without murmurs or gallops. No lower extremity pitting edema. Anxious and upset      Assessment & Plan:   Situational stress with anxiety-discussed at length with patient today. He does not want to go to counseling.  Vague chest pain-do not think he has acute coronary syndrome but could benefit from cardiac evaluation in the future  Insulin-dependent diabetes mellitus stable: Januvia Metformin and Lantus insulin  Hyperlipidemia treated with statin and fenofibrate  Hypertension-treated with Benicar, amlodipine and HCTZ  Obesity  Metabolic syndrome  History of depression treated with Wellbutrin  Plan: Medical issues are under good control on current regimen. However I think he needs cardiology evaluation. We spent 35 minutes with him today with careful evaluation of chest pain and speaking with him about situational stress at length. Have prescribed Klonopin 1 mg tablets twice daily as needed for acute anxiety and he will continue with Wellbutrin.

## 2020-01-09 NOTE — Patient Instructions (Addendum)
Take Klonopin 1 mg up to twice daily as needed for anxiety continue Wellbutrin. Continue other medications previously prescribed. We will arrange for cardiology consultation. Call if symptoms or not improving. Keep appointment in July for physical exam and fasting labs.

## 2020-01-10 ENCOUNTER — Other Ambulatory Visit: Payer: Self-pay

## 2020-01-10 DIAGNOSIS — R079 Chest pain, unspecified: Secondary | ICD-10-CM

## 2020-01-16 ENCOUNTER — Other Ambulatory Visit: Payer: Self-pay | Admitting: Internal Medicine

## 2020-01-24 ENCOUNTER — Other Ambulatory Visit: Payer: PPO | Admitting: Internal Medicine

## 2020-01-24 DIAGNOSIS — L821 Other seborrheic keratosis: Secondary | ICD-10-CM | POA: Diagnosis not present

## 2020-01-24 DIAGNOSIS — Z85828 Personal history of other malignant neoplasm of skin: Secondary | ICD-10-CM | POA: Diagnosis not present

## 2020-01-24 DIAGNOSIS — D225 Melanocytic nevi of trunk: Secondary | ICD-10-CM | POA: Diagnosis not present

## 2020-01-24 DIAGNOSIS — L570 Actinic keratosis: Secondary | ICD-10-CM | POA: Diagnosis not present

## 2020-01-24 DIAGNOSIS — L2089 Other atopic dermatitis: Secondary | ICD-10-CM | POA: Diagnosis not present

## 2020-01-25 ENCOUNTER — Other Ambulatory Visit: Payer: Self-pay | Admitting: Internal Medicine

## 2020-01-25 ENCOUNTER — Ambulatory Visit (INDEPENDENT_AMBULATORY_CARE_PROVIDER_SITE_OTHER): Payer: PPO | Admitting: Internal Medicine

## 2020-01-25 ENCOUNTER — Other Ambulatory Visit: Payer: Self-pay

## 2020-01-25 ENCOUNTER — Encounter: Payer: Self-pay | Admitting: Internal Medicine

## 2020-01-25 VITALS — BP 130/80 | HR 84 | Ht 72.0 in | Wt 246.0 lb

## 2020-01-25 DIAGNOSIS — E8881 Metabolic syndrome: Secondary | ICD-10-CM | POA: Diagnosis not present

## 2020-01-25 DIAGNOSIS — Z0000 Encounter for general adult medical examination without abnormal findings: Secondary | ICD-10-CM

## 2020-01-25 DIAGNOSIS — E785 Hyperlipidemia, unspecified: Secondary | ICD-10-CM | POA: Diagnosis not present

## 2020-01-25 DIAGNOSIS — E1169 Type 2 diabetes mellitus with other specified complication: Secondary | ICD-10-CM

## 2020-01-25 DIAGNOSIS — I10 Essential (primary) hypertension: Secondary | ICD-10-CM

## 2020-01-25 DIAGNOSIS — Z125 Encounter for screening for malignant neoplasm of prostate: Secondary | ICD-10-CM | POA: Diagnosis not present

## 2020-01-25 DIAGNOSIS — Z8659 Personal history of other mental and behavioral disorders: Secondary | ICD-10-CM

## 2020-01-25 DIAGNOSIS — Z6833 Body mass index (BMI) 33.0-33.9, adult: Secondary | ICD-10-CM

## 2020-01-25 DIAGNOSIS — F439 Reaction to severe stress, unspecified: Secondary | ICD-10-CM

## 2020-01-25 DIAGNOSIS — E119 Type 2 diabetes mellitus without complications: Secondary | ICD-10-CM | POA: Diagnosis not present

## 2020-01-25 DIAGNOSIS — N401 Enlarged prostate with lower urinary tract symptoms: Secondary | ICD-10-CM

## 2020-01-25 LAB — POCT URINALYSIS DIPSTICK
Appearance: NEGATIVE
Bilirubin, UA: NEGATIVE
Blood, UA: NEGATIVE
Glucose, UA: NEGATIVE
Ketones, UA: NEGATIVE
Leukocytes, UA: NEGATIVE
Nitrite, UA: NEGATIVE
Odor: NEGATIVE
Protein, UA: NEGATIVE
Spec Grav, UA: 1.01 (ref 1.010–1.025)
Urobilinogen, UA: 0.2 E.U./dL
pH, UA: 6.5 (ref 5.0–8.0)

## 2020-01-25 NOTE — Addendum Note (Signed)
Addended by: Mady Haagensen on: 01/25/2020 10:38 AM   Modules accepted: Orders

## 2020-01-25 NOTE — Progress Notes (Signed)
Subjective:    Patient ID: [name] [name], male    DOB: 08-20-1950, 69 y.o.   MRN: [id number]  HPI 69 year old Male for health maintenance exam Medicare wellness visit, and evaluation of medical issues. Labs drawn today and pending.  He was seen in early June with anxiety and some chest discomfort thought to be related to anxiety due to his situation with his daughter who has history of substance abuse.  He has upcoming appointment with Cardiologist.  He has a history of diabetes mellitus and hypertension.  He has anxiety and depression.  History of gout.  History of hyperlipidemia.  He is compliant with his medications.  He exercises by playing golf and also with his work as a Chief Strategy Officer.  History of obesity and metabolic syndrome.  History of back pain status post L2-L3 discectomy March 2014 right lower extremity radiculopathy and back pain.  Colonoscopy done in 2010 with history of adenomatous colon polyps.  Needs repeat study.  Cardiolite study 2004 was negative.  Has had Pneumovax and Prevnar immunizations.  Has had two Pfizer COVID-19 immunizations.  Tetanus immunization is up-to-date.  Reminded about annual diabetic eye exam.  History of degenerative arthritis AC joint of right shoulder in 2007 seen by Dr. Daylene Katayama.  In March 1999 he had a partial tear of gastrocnemius muscle in the left leg treated with Dr. Sharol Given.  He had a cyst removed from his spine in 1977 and it actually may have been a pilonidal cyst.  History of lumbar disc disease L4-L5 and L5-S1 in 1998.  He presented with lumbar radiculopathy at the time.  He received epidural steroids x3 with improvement.  Social history: He is married.  First wife died of cancer.  This is his second marriage.  Completed 1 year of college.  Enjoys golf.  Does not smoke.  Drinks beer.  Two adult children, a son and a daughter.  He is a self-employed Hydrologist.  Family history: Mother died of stomach cancer at age 29.  Father died  of lung cancer at age 75.  His parents apparently died within 80 months of each other when he was 63 or 82 years old.  Three sisters in good health.    Review of Systems  Constitutional: Negative.   HENT: Negative.   Eyes: Negative.   Respiratory: Negative.   Cardiovascular:       No recent chest pain since situation with daughter has calmed down  Gastrointestinal: Negative.   Endocrine: Negative.   Genitourinary:       Nocturia  Musculoskeletal: Positive for arthralgias.  Neurological: Negative.   Psychiatric/Behavioral:       History of anxiety and depression   issues with nocturia-previously prescribed Flomax and will refill     Objective:   Physical Exam Blood pressure 130/80, pulse eighty-four, pulse oximetry 98% weight 246 pounds BMI 33.36  Skin warm and dry.  Nodes none.  TMs are clear.  Neck is supple without JVD thyromegaly or carotid bruits.  Chest is clear to auscultation.  Cardiac exam regular rate and rhythm normal S1 and S2 without murmurs or gallops.  Abdomen soft nondistended without hepatosplenomegaly masses or tenderness.  Prostate is normal without nodules.  No lower extremity pitting edema.  Affect thought and judgment are normal.       Assessment & Plan:  Type 2 diabetes mellitus-stable and under good control  Nocturia and BPH treated with Flomax.  Recent episode of chest pain associated with situational stress-seeing cardiologist  soon-has risk factors for coronary disease  Anxiety depression treated with Klonopin and Wellbutrin.  Symptoms are stable.  Essential hypertension stable on amlodipine HCTZ metoprolol and Benicar.  Hyperlipidemia treated with Crestor 5 mg once a week  BMI thirty-three-encourage diet exercise and weight loss  Metabolic syndrome  Plan: Continue current medications.  Keep appointment for Cardiology evaluation.  Return here in 6 months for 45-month recheck of lipids, hypertension and diabetes.  Have flu vaccine in the  Fall.  Subjective:   Patient presents for Medicare Annual/Subsequent preventive examination.  Review Past Medical/Family/Social: See above   Risk Factors  Current exercise habits: Plays golf and gets exercise with work Dietary issues discussed: Low-fat low carbohydrate  Cardiac risk factors: Diabetes and hyperlipidemia  Depression Screen  (Note: if answer to either of the following is "Yes", a more complete depression screening is indicated)   Over the past two weeks, have you felt down, depressed or hopeless? No  Over the past two weeks, have you felt little interest or pleasure in doing things? No Have you lost interest or pleasure in daily life? No Do you often feel hopeless? No Do you cry easily over simple problems? No   Activities of Daily Living  In your present state of health, do you have any difficulty performing the following activities?:   Driving? No  Managing money? No  Feeding yourself? No  Getting from bed to chair? No  Climbing a flight of stairs? No  Preparing food and eating?: No  Bathing or showering? No  Getting dressed: No  Getting to the toilet? No  Using the toilet:No  Moving around from place to place: No  In the past year have you fallen or had a near fall?:No  Are you sexually active? No  Do you have more than one partner? No   Hearing Difficulties: No  Do you often ask people to speak up or repeat themselves? No  Do you experience ringing or noises in your ears? No  Do you have difficulty understanding soft or whispered voices? No  Do you feel that you have a problem with memory? No Do you often misplace items? No    Home Safety:  Do you have a smoke alarm at your residence? Yes Do you have grab bars in the bathroom?  No Do you have throw rugs in your house?  None   Cognitive Testing  Alert? Yes Normal Appearance?Yes  Oriented to person? Yes Place? Yes  Time? Yes  Recall of three objects? Yes  Can perform simple calculations? Yes   Displays appropriate judgment?Yes  Can read the correct time from a watch face?Yes   List the Names of Other Physician/Practitioners you currently use:  See referral list for the physicians patient is currently seeing.     Review of Systems: See above   Objective:     General appearance: Appears stated age and mildly obese  Head: Normocephalic, without obvious abnormality, atraumatic  Eyes: conj clear, EOMi PEERLA  Ears: normal TM's and external ear canals both ears  Nose: Nares normal. Septum midline. Mucosa normal. No drainage or sinus tenderness.  Throat: lips, mucosa, and tongue normal; teeth and gums normal  Neck: no adenopathy, no carotid bruit, no JVD, supple, symmetrical, trachea midline and thyroid not enlarged, symmetric, no tenderness/mass/nodules  No CVA tenderness.  Lungs: clear to auscultation bilaterally  Breasts: normal appearance, no masses or tenderness Heart: regular rate and rhythm, S1, S2 normal, no murmur, click, rub or gallop  Abdomen:  soft, non-tender; bowel sounds normal; no masses, no organomegaly  Musculoskeletal: ROM normal in all joints, no crepitus, no deformity, Normal muscle strengthen. Back  is symmetric, no curvature. Skin: Skin color, texture, turgor normal. No rashes or lesions  Lymph nodes: Cervical, supraclavicular, and axillary nodes normal.  Neurologic: CN 2 -12 Normal, Normal symmetric reflexes. Normal coordination and gait  Psych: Alert & Oriented x 3, Mood appear stable.    Assessment:    Annual wellness medicare exam   Plan:    During the course of the visit the patient was educated and counseled about appropriate screening and preventive services including:   Annual flu vaccine  Needs colonoscopy     Patient Instructions (the written plan) was given to the patient.  Medicare Attestation  I have personally reviewed:  The patient's medical and social history  Their use of alcohol, tobacco or illicit drugs  Their current  medications and supplements  The patient's functional ability including ADLs,fall risks, home safety risks, cognitive, and hearing and visual impairment  Diet and physical activities  Evidence for depression or mood disorders  The patient's weight, height, BMI, and visual acuity have been recorded in the chart. I have made referrals, counseling, and provided education to the patient based on review of the above and I have provided the patient with a written personalized care plan for preventive services.

## 2020-01-26 LAB — CBC WITH DIFFERENTIAL/PLATELET
Absolute Monocytes: 616 cells/uL (ref 200–950)
Basophils Absolute: 40 cells/uL (ref 0–200)
Basophils Relative: 0.5 %
Eosinophils Absolute: 111 cells/uL (ref 15–500)
Eosinophils Relative: 1.4 %
HCT: 42.3 % (ref 38.5–50.0)
Hemoglobin: 14.1 g/dL (ref 13.2–17.1)
Lymphs Abs: 2038 cells/uL (ref 850–3900)
MCH: 30.5 pg (ref 27.0–33.0)
MCHC: 33.3 g/dL (ref 32.0–36.0)
MCV: 91.6 fL (ref 80.0–100.0)
MPV: 10.8 fL (ref 7.5–12.5)
Monocytes Relative: 7.8 %
Neutro Abs: 5096 cells/uL (ref 1500–7800)
Neutrophils Relative %: 64.5 %
Platelets: 223 10*3/uL (ref 140–400)
RBC: 4.62 10*6/uL (ref 4.20–5.80)
RDW: 13.1 % (ref 11.0–15.0)
Total Lymphocyte: 25.8 %
WBC: 7.9 10*3/uL (ref 3.8–10.8)

## 2020-01-26 LAB — HEMOGLOBIN A1C
Hgb A1c MFr Bld: 5.8 % of total Hgb — ABNORMAL HIGH (ref <5.7)
Mean Plasma Glucose: 120 (calc)
eAG (mmol/L): 6.6 (calc)

## 2020-01-26 LAB — LIPID PANEL
Cholesterol: 179 mg/dL (ref <200)
HDL: 49 mg/dL (ref >=40)
LDL Cholesterol (Calc): 111 mg/dL (calc) — ABNORMAL HIGH
Non-HDL Cholesterol (Calc): 130 mg/dL (calc) — ABNORMAL HIGH (ref <130)
Total CHOL/HDL Ratio: 3.7 (calc) (ref <5.0)
Triglycerides: 91 mg/dL (ref <150)

## 2020-01-26 LAB — COMPLETE METABOLIC PANEL WITH GFR
AG Ratio: 2.1 (calc) (ref 1.0–2.5)
ALT: 21 U/L (ref 9–46)
AST: 23 U/L (ref 10–35)
Albumin: 4.6 g/dL (ref 3.6–5.1)
Alkaline phosphatase (APISO): 23 U/L — ABNORMAL LOW (ref 35–144)
BUN: 11 mg/dL (ref 7–25)
CO2: 30 mmol/L (ref 20–32)
Calcium: 9.7 mg/dL (ref 8.6–10.3)
Chloride: 93 mmol/L — ABNORMAL LOW (ref 98–110)
Creat: 0.92 mg/dL (ref 0.70–1.25)
GFR, Est African American: 99 mL/min/{1.73_m2} (ref >=60)
GFR, Est Non African American: 85 mL/min/{1.73_m2} (ref >=60)
Globulin: 2.2 g/dL (calc) (ref 1.9–3.7)
Glucose, Bld: 91 mg/dL (ref 65–99)
Potassium: 4.3 mmol/L (ref 3.5–5.3)
Sodium: 131 mmol/L — ABNORMAL LOW (ref 135–146)
Total Bilirubin: 0.9 mg/dL (ref 0.2–1.2)
Total Protein: 6.8 g/dL (ref 6.1–8.1)

## 2020-01-26 LAB — PSA: PSA: 2.7 ng/mL (ref <=4.0)

## 2020-01-27 NOTE — Progress Notes (Signed)
Cardiology Office Note:    Date:  01/28/2020   ID:  [name] [name], DOB 1951/04/30, MRN [id number]  PCP:  Elby Showers, MD  Cardiologist:  Shirlee More, MD   Referring MD: Elby Showers, MD  ASSESSMENT:    1. Chest pain, precordial   2. Essential hypertension   3. Hypertriglyceridemia    PLAN:    In order of problems listed above:  1. Symptoms are atypical stress related but he is in a very high risk group diabetes hypertension dyslipidemia and after review modalities will undergo cardiac CTA. If high risk markers would benefit from further evaluation and revascularization. He is on aspirin and statin 2. Stable hypertension continue current treatment including calcium channel blocker 3. stable diabetes managed by his PCP 4. stable dyslipidemia continue fenofibrate and high intensity statin.  Next appointment 6 weeks   Medication Adjustments/Labs and Tests Ordered: Current medicines are reviewed at length with the patient today.  Concerns regarding medicines are outlined above.  Orders Placed This Encounter  Procedures  . CT CORONARY MORPH W/CTA COR W/SCORE W/CA W/CM &/OR WO/CM  . CT CORONARY FRACTIONAL FLOW RESERVE DATA PREP  . CT CORONARY FRACTIONAL FLOW RESERVE FLUID ANALYSIS  . Basic metabolic panel   No orders of the defined types were placed in this encounter.    No chief complaint on file.   History of Present Illness:    [name] [name] is a 69 y.o. male with T 2 DM on insulin, hypertension and hyperlipidemia who is being seen today for the evaluation of chest pain at the request of Elby Showers, MD.  EKG 12/14/2019: [place] and normal  He had a stress test performed somewhere in the range of 15 years ago which was normal. He has been under intense stress with her daughter with addiction problems and associated with these extreme episodes he gets a tightness in his chest. One about a month ago was particularly severe thought he might be having a heart attack  was seen by his PCP and his EKG was normal. He has had no further episode and these are not exertional in nature. He has high risk with diabetes hypertension hyperlipidemia no known history of heart disease congenital rheumatic CAD heart failure or atrial fibrillation. He notices that his strength and endurance are not what it was a year ago but is still a very active man and he also has exertional shortness of breath when he does activities like gardening work. No edema orthopnea though he does have marked varicosity lower extremity Past Medical History:  Diagnosis Date  . BPH (benign prostatic hypertrophy)   . Depression   . ED (erectile dysfunction)     History reviewed. No pertinent surgical history.  Current Medications: Current Meds  Medication Sig  . allopurinol (ZYLOPRIM) 300 MG tablet TAKE ONE TABLET BY MOUTH ONE TIME DAILY  . amLODipine (NORVASC) 5 MG tablet TAKE ONE TABLET BY MOUTH ONE TIME DAILY  . aspirin 81 MG tablet Take 81 mg by mouth daily.    . B-D UF III MINI PEN NEEDLES 31G X 5 MM MISC USE AS DIRECTED  . Blood Glucose Monitoring Suppl (ONETOUCH VERIO FLEX SYSTEM) w/Device KIT USE TO TEST BLOOD SUGAR  . buPROPion (WELLBUTRIN XL) 300 MG 24 hr tablet TAKE ONE TABLET BY MOUTH ONE TIME DAILY  . clonazePAM (KLONOPIN) 1 MG tablet Take 1 tablet (1 mg total) by mouth 2 (two) times daily as needed for anxiety.  . cyclobenzaprine (FLEXERIL)  10 MG tablet Take 1 tablet (10 mg total) by mouth at bedtime as needed for muscle spasms.  . fenofibrate 160 MG tablet TAKE ONE TABLET BY MOUTH ONE TIME DAILY  . hydrochlorothiazide (HYDRODIURIL) 25 MG tablet TAKE ONE TABLET BY MOUTH ONE TIME DAILY  . Insulin Glargine (LANTUS SOLOSTAR) 100 UNIT/ML Solostar Pen Inject 22 Units into the skin daily.  Marland Kitchen JANUVIA 100 MG tablet TAKE ONE TABLET BY MOUTH ONE TIME DAILY  . metFORMIN (GLUCOPHAGE) 1000 MG tablet TAKE ONE TABLET BY MOUTH TWICE A DAY WITH MEALS  . Multiple Vitamin (MULTIVITAMIN) tablet Take 1  tablet by mouth daily.    Marland Kitchen olmesartan (BENICAR) 40 MG tablet TAKE ONE TABLET BY MOUTH ONE TIME DAILY  . rosuvastatin (CRESTOR) 5 MG tablet TAKE ONE TABLET BY MOUTH ONCE A WEEK.  . tamsulosin (FLOMAX) 0.4 MG CAPS capsule TAKE ONE CAPSULE BY MOUTH ONE TIME DAILY  . triamcinolone cream (KENALOG) 0.1 % Apply 1 application topically 3 (three) times daily.     Allergies:   Patient has no known allergies.   Social History   Socioeconomic History  . Marital status: Married    Spouse name: Not on file  . Number of children: Not on file  . Years of education: Not on file  . Highest education level: Not on file  Occupational History  . Not on file  Tobacco Use  . Smoking status: Former Smoker    Types: Cigarettes    Quit date: 12/27/1989    Years since quitting: 30.1  . Smokeless tobacco: Never Used  Substance and Sexual Activity  . Alcohol use: Yes    Alcohol/week: 6.0 - 9.0 standard drinks    Types: 6 - 9 Cans of beer per week    Comment: beer 6 pack per day   . Drug use: No  . Sexual activity: Not on file  Other Topics Concern  . Not on file  Social History Narrative  . Not on file   Social Determinants of Health   Financial Resource Strain:   . Difficulty of Paying Living Expenses:   Food Insecurity:   . Worried About Charity fundraiser in the Last Year:   . Arboriculturist in the Last Year:   Transportation Needs:   . Film/video editor (Medical):   Marland Kitchen Lack of Transportation (Non-Medical):   Physical Activity:   . Days of Exercise per Week:   . Minutes of Exercise per Session:   Stress:   . Feeling of Stress :   Social Connections:   . Frequency of Communication with Friends and Family:   . Frequency of Social Gatherings with Friends and Family:   . Attends Religious Services:   . Active Member of Clubs or Organizations:   . Attends Archivist Meetings:   Marland Kitchen Marital Status:      Family History: The patient's family history includes Cancer in his  father and mother; Stomach cancer in his mother. There is no history of Colon cancer.  ROS:   Review of Systems  Constitutional: Positive for malaise/fatigue.  HENT: Negative.   Eyes: Negative.   Cardiovascular: Positive for chest pain and dyspnea on exertion.  Respiratory: Positive for shortness of breath.   Endocrine: Negative.   Hematologic/Lymphatic: Negative.   Skin: Negative.   Musculoskeletal: Negative.   Gastrointestinal: Negative.   Genitourinary: Negative.   Neurological: Negative.   Psychiatric/Behavioral: The patient is nervous/anxious.   Allergic/Immunologic: Negative.    Please see  the history of present illness.     All other systems reviewed and are negative.  EKGs/Labs/Other Studies Reviewed:    The following studies were reviewed today:    Recent Labs: 01/25/2020: ALT 21; BUN 11; Creat 0.92; Hemoglobin 14.1; Platelets 223; Potassium 4.3; Sodium 131  Recent Lipid Panel    Component Value Date/Time   CHOL 179 01/25/2020 1218   TRIG 91 01/25/2020 1218   HDL 49 01/25/2020 1218   CHOLHDL 3.7 01/25/2020 1218   VLDL 26 10/24/2016 1222   LDLCALC 111 (H) 01/25/2020 1218    Physical Exam:    VS:  BP (!) 144/72   Pulse 94   Ht 6' (1.829 m)   Wt 246 lb (111.6 kg)   SpO2 95%   BMI 33.36 kg/m     Wt Readings from Last 3 Encounters:  01/28/20 246 lb (111.6 kg)  01/25/20 246 lb (111.6 kg)  12/14/19 244 lb (110.7 kg)     GEN:  Well nourished, well developed in no acute distress HEENT: Normal NECK: No JVD; No carotid bruits LYMPHATICS: No lymphadenopathy CARDIAC: RRR, no murmurs, rubs, gallops RESPIRATORY:  Clear to auscultation without rales, wheezing or rhonchi  ABDOMEN: Soft, non-tender, non-distended MUSCULOSKELETAL:  No edema; No deformity  SKIN: Warm and dry NEUROLOGIC:  Alert and oriented x 3 PSYCHIATRIC:  Normal affect     Signed, Shirlee More, MD  01/28/2020 2:58 PM    [place]

## 2020-01-28 ENCOUNTER — Ambulatory Visit: Payer: PPO | Admitting: Cardiology

## 2020-01-28 ENCOUNTER — Encounter: Payer: Self-pay | Admitting: Cardiology

## 2020-01-28 ENCOUNTER — Other Ambulatory Visit: Payer: Self-pay

## 2020-01-28 VITALS — BP 144/72 | HR 94 | Ht 72.0 in | Wt 246.0 lb

## 2020-01-28 DIAGNOSIS — R072 Precordial pain: Secondary | ICD-10-CM | POA: Diagnosis not present

## 2020-01-28 DIAGNOSIS — I10 Essential (primary) hypertension: Secondary | ICD-10-CM | POA: Diagnosis not present

## 2020-01-28 DIAGNOSIS — E781 Pure hyperglyceridemia: Secondary | ICD-10-CM | POA: Diagnosis not present

## 2020-01-28 DIAGNOSIS — R079 Chest pain, unspecified: Secondary | ICD-10-CM | POA: Insufficient documentation

## 2020-01-28 MED ORDER — METOPROLOL TARTRATE 100 MG PO TABS
100.0000 mg | ORAL_TABLET | Freq: Once | ORAL | 0 refills | Status: DC
Start: 2020-01-28 — End: 2020-04-10

## 2020-01-28 NOTE — Progress Notes (Signed)
Many thanks. 

## 2020-01-28 NOTE — Patient Instructions (Addendum)
Medication Instructions:  Your physician recommends that you continue on your current medications as directed. Please refer to the Current Medication list given to you today.  *If you need a refill on your cardiac medications before your next appointment, please call your pharmacy*   Lab Work: Your physician recommends that you return for lab work in: Within one week of your CT BMP If you have labs (blood work) drawn today and your tests are completely normal, you will receive your results only by: Marland Kitchen MyChart Message (if you have MyChart) OR . A paper copy in the mail If you have any lab test that is abnormal or we need to change your treatment, we will call you to review the results.   Testing/Procedures: Your cardiac CT will be scheduled at the below location:   [facility] [address] [place], [place] [address] [phone]  If scheduled at [facility], please arrive at the [facility] main entrance of [facility] 30 minutes prior to test start time. Proceed to the [facility] Radiology Department (first floor) to check-in and test prep.  Please follow these instructions carefully (unless otherwise directed):  Hold all erectile dysfunction medications at least 3 days (72 hrs) prior to test.  On the Night Before the Test: . Be sure to Drink plenty of water. . Do not consume any caffeinated/decaffeinated beverages or chocolate 12 hours prior to your test. . Do not take any antihistamines 12 hours prior to your test.  On the Day of the Test: . Drink plenty of water. Do not drink any water within one hour of the test. . Do not eat any food 4 hours prior to the test. . You may take your regular medications prior to the test.  . Take metoprolol (Lopressor) two hours prior to test. . HOLD Hydrochlorothiazide morning of the test.       After the Test: . Drink plenty of water. . After receiving IV contrast, you may experience a mild flushed  feeling. This is normal. . On occasion, you may experience a mild rash up to 24 hours after the test. This is not dangerous. If this occurs, you can take Benadryl 25 mg and increase your fluid intake. . If you experience trouble breathing, this can be serious. If it is severe call 911 IMMEDIATELY. If it is mild, please call our office. . If you take any of these medications: Glipizide/Metformin, Avandament, Glucavance, please do not take 48 hours after completing test unless otherwise instructed.   Once we have confirmed authorization from your insurance company, we will call you to set up a date and time for your test. Based on how quickly your insurance processes prior authorizations requests, please allow up to 4 weeks to be contacted for scheduling your Cardiac CT appointment. Be advised that routine Cardiac CT appointments could be scheduled as many as 8 weeks after your provider has ordered it.  For non-scheduling related questions, please contact the cardiac imaging nurse navigator should you have any questions/concerns: Marchia Bond, Cardiac Imaging Nurse Navigator Burley Saver, Interim Cardiac Imaging Nurse [facility] Direct Office Dial: [phone]   For scheduling needs, including cancellations and rescheduling, please call Vivien Rota at [phone], option [phone].     Follow-Up: At [facility], you and your health needs are our priority.  As part of our continuing mission to provide you with exceptional heart care, we have created designated Provider Care Teams.  These Care  Teams include your primary Cardiologist (physician) and Advanced Practice Providers (APPs -  Physician Assistants and Nurse Practitioners) who all work together to provide you with the care you need, when you need it.  We recommend signing up for the patient portal called "MyChart".  Sign up information is provided on this After Visit Summary.  MyChart is used to connect with patients  for Virtual Visits (Telemedicine).  Patients are able to view lab/test results, encounter notes, upcoming appointments, etc.  Non-urgent messages can be sent to your provider as well.   To learn more about what you can do with MyChart, go to [email or web address].    Your next appointment:  6 weeks  The format for your next appointment:   In Person  Provider:   Shirlee More, MD   Other Instructions

## 2020-02-06 ENCOUNTER — Encounter: Payer: Self-pay | Admitting: Internal Medicine

## 2020-02-06 NOTE — Patient Instructions (Addendum)
It was a pleasure to see you today.  Keep up excellent control of diabetes at 5.8%.  Lipids are stable.  LDL is 111.  Watch diet.  Need to have colonoscopy in the near future.  Need flu vaccine in the fall.  Reminded about diabetic eye exam on annual basis.  Return in 6 months.

## 2020-02-17 ENCOUNTER — Telehealth: Payer: Self-pay | Admitting: Cardiology

## 2020-02-17 NOTE — Telephone Encounter (Signed)
Patient is calling stating he has been at the [facility] location on [address] for labs for approximately an hour. Labs ordered by Dr. Bettina Gavia need to be released. Please advise.

## 2020-02-17 NOTE — Telephone Encounter (Signed)
[name] [name] with [facility] is calling to follow up in regards to lab orders. She states she is unable to see order. Please assist.

## 2020-02-18 ENCOUNTER — Other Ambulatory Visit: Payer: Self-pay | Admitting: *Deleted

## 2020-02-18 DIAGNOSIS — R072 Precordial pain: Secondary | ICD-10-CM | POA: Diagnosis not present

## 2020-02-19 LAB — BASIC METABOLIC PANEL
BUN/Creatinine Ratio: 12 (ref 10–24)
BUN: 12 mg/dL (ref 8–27)
CO2: 26 mmol/L (ref 20–29)
Calcium: 9.9 mg/dL (ref 8.6–10.2)
Chloride: 94 mmol/L — ABNORMAL LOW (ref 96–106)
Creatinine, Ser: 0.97 mg/dL (ref 0.76–1.27)
GFR calc Af Amer: 92 mL/min/{1.73_m2} (ref >59)
GFR calc non Af Amer: 80 mL/min/{1.73_m2} (ref >59)
Glucose: 121 mg/dL — ABNORMAL HIGH (ref 65–99)
Potassium: 4.7 mmol/L (ref 3.5–5.2)
Sodium: 133 mmol/L — ABNORMAL LOW (ref 134–144)

## 2020-02-21 ENCOUNTER — Telehealth: Payer: Self-pay

## 2020-02-21 ENCOUNTER — Telehealth (HOSPITAL_COMMUNITY): Payer: Self-pay | Admitting: *Deleted

## 2020-02-21 NOTE — Telephone Encounter (Signed)
-----   Message from Berniece Salines, DO sent at 02/21/2020  8:15 AM EDT ----- Your blood glucose is slightly elevated and the lab 3 weeks ago, otherwise stable labs.

## 2020-02-21 NOTE — Telephone Encounter (Signed)
Reaching out to patient to offer assistance regarding upcoming cardiac imaging study; pt verbalizes understanding of appt date/time, parking situation and where to check in, pre-test NPO status and medications ordered, and verified current allergies; name and call back number provided for further questions should they arise ° °Elizabethanne Lusher Tai RN Navigator Cardiac Imaging °[facility] °[phone] office °[phone] cell ° °

## 2020-02-21 NOTE — Telephone Encounter (Signed)
Spoke with patient regarding results and recommendation.  Patient verbalizes understanding and is agreeable to plan of care. Advised patient to call back with any issues or concerns.  

## 2020-02-23 ENCOUNTER — Telehealth: Payer: Self-pay

## 2020-02-23 ENCOUNTER — Encounter (HOSPITAL_COMMUNITY): Payer: Self-pay

## 2020-02-23 ENCOUNTER — Encounter: Payer: PPO | Admitting: *Deleted

## 2020-02-23 ENCOUNTER — Ambulatory Visit (HOSPITAL_COMMUNITY)
Admission: RE | Admit: 2020-02-23 | Discharge: 2020-02-23 | Disposition: A | Discharge status: Home / Self Care | Payer: PPO | Source: Ambulatory Visit | Attending: Cardiology | Admitting: Cardiology

## 2020-02-23 ENCOUNTER — Other Ambulatory Visit: Payer: Self-pay

## 2020-02-23 ENCOUNTER — Ambulatory Visit (HOSPITAL_COMMUNITY): Admission: RE | Admit: 2020-02-23 | Payer: PPO | Source: Ambulatory Visit

## 2020-02-23 DIAGNOSIS — R072 Precordial pain: Secondary | ICD-10-CM | POA: Insufficient documentation

## 2020-02-23 DIAGNOSIS — I209 Angina pectoris, unspecified: Secondary | ICD-10-CM | POA: Diagnosis not present

## 2020-02-23 DIAGNOSIS — Z006 Encounter for examination for normal comparison and control in clinical research program: Secondary | ICD-10-CM

## 2020-02-23 MED ORDER — IOHEXOL 350 MG/ML SOLN
80.0000 mL | Freq: Once | INTRAVENOUS | Status: AC | PRN
  Administered 2020-02-23: 80 mL via INTRAVENOUS

## 2020-02-23 MED ORDER — NITROGLYCERIN 0.4 MG SL SUBL: 0.8000 mg | SUBLINGUAL_TABLET | Freq: Once | SUBLINGUAL | Status: AC

## 2020-02-23 MED ORDER — NITROGLYCERIN 0.4 MG SL SUBL
SUBLINGUAL_TABLET | SUBLINGUAL | Status: AC
  Administered 2020-02-23: 0.8 mg via SUBLINGUAL
  Filled 2020-02-23: qty 2

## 2020-02-23 NOTE — Research (Signed)
CADFEM Informed Consent                  Subject Name:   [name] [name]   Subject met inclusion and exclusion criteria.  The informed consent form, study requirements and expectations were reviewed with the subject and questions and concerns were addressed prior to the signing of the consent form.  The subject verbalized understanding of the trial requirements.  The subject agreed to participate in the CADFEM trial and signed the informed consent.  The informed consent was obtained prior to performance of any protocol-specific procedures for the subject.  A copy of the signed informed consent was given to the subject and a copy was placed in the subject's medical record.   [place] Townsend Cudworth, Research Assistant  02/23/2020  08:54 a.m.

## 2020-02-23 NOTE — Telephone Encounter (Signed)
Spoke with Diane from [facility] Radiology she wanted to report an addendum that was added to the CT Heart study done today 02/23/20. I verified that I could see this addendum in the chart and she just wanted to be sure that this message was passed to Dr. Bettina Gavia so that the addendum was read.

## 2020-02-24 ENCOUNTER — Telehealth: Payer: Self-pay

## 2020-02-24 DIAGNOSIS — E785 Hyperlipidemia, unspecified: Secondary | ICD-10-CM

## 2020-02-24 DIAGNOSIS — E781 Pure hyperglyceridemia: Secondary | ICD-10-CM

## 2020-02-24 NOTE — Telephone Encounter (Signed)
Spoke with patient regarding results and recommendation.  Patient verbalizes understanding and is agreeable to plan of care. Advised patient to call back with any issues or concerns.   Patient is aware that a referral has been placed for the lipid clinic in order to help get him started on [place] and that they will call to schedule this appointment with him.

## 2020-02-25 ENCOUNTER — Other Ambulatory Visit: Payer: Self-pay | Admitting: Internal Medicine

## 2020-02-26 DIAGNOSIS — R072 Precordial pain: Secondary | ICD-10-CM | POA: Diagnosis not present

## 2020-02-28 ENCOUNTER — Telehealth: Payer: Self-pay

## 2020-02-28 NOTE — Telephone Encounter (Signed)
Left message on patients voicemail to please return our call.   

## 2020-02-28 NOTE — Telephone Encounter (Signed)
-----   Message from Richardo Priest, MD sent at 02/26/2020 12:11 PM EDT ----- Final FR is good, advise medications, should have office FU

## 2020-02-28 NOTE — Telephone Encounter (Signed)
Spoke with patient regarding results and recommendation.  Patient verbalizes understanding and is agreeable to plan of care. Advised patient to call back with any issues or concerns.  

## 2020-03-08 DIAGNOSIS — Z20828 Contact with and (suspected) exposure to other viral communicable diseases: Secondary | ICD-10-CM | POA: Diagnosis not present

## 2020-03-30 ENCOUNTER — Other Ambulatory Visit: Payer: Self-pay

## 2020-03-30 ENCOUNTER — Ambulatory Visit (INDEPENDENT_AMBULATORY_CARE_PROVIDER_SITE_OTHER): Payer: PPO | Admitting: Pharmacist Clinician (PhC)/ Clinical Pharmacy Specialist

## 2020-03-30 DIAGNOSIS — E785 Hyperlipidemia, unspecified: Secondary | ICD-10-CM

## 2020-03-30 MED ORDER — ROSUVASTATIN CALCIUM 5 MG PO TABS
5.0000 mg | ORAL_TABLET | Freq: Every day | ORAL | 3 refills | Status: DC
Start: 2020-03-30 — End: 2021-03-20

## 2020-03-30 NOTE — Progress Notes (Signed)
04/03/2020 YOUCEF KLAS 1951-04-21 [id number]   HPI:  [name] [name] is a 69 y.o. male patient of Dr Bettina Gavia, who presents today for a lipid clinic evaluation.  See pertinent past medical history below.  He was referred after a coronary calcium score was found to be at 2,708, putting him in the 97th percentile for age and sex.  He doesn't have a family history of ASCVD, although both of his parents died fairly young, both from cancer.    Today patient reports that he currently takes the fenofibrate daily for his triglycerides, but was told to take the rosuvastatin only once weekly.  Previously he took atorvastatin 80 mg (from about 2013-2019) and had LDL as low as 56.  He doesn't recall any issues with the medication, and has no indication in his chart that it caused myalgias or other problems.    Past Medical History: hypertension 172/92 in office today - on amlodipine 5 mg, hctz 25 mg, olmesartan 40 mg  DM2 7/21: A1c 5.8 - on Lantus, Januvia, metformin    Current Medications: fenofibrate 160 mg; rosuvastatin 5 mg once weekly  Cholesterol Goals:  <70   Intolerant/previously tried: atorvastatin 80 mg (unknown why d/c)  Family history: both parents died of cancer; lung and stomach 53, 61  Same year;  Siblings without heart history; children 32, 42, no history  Diet: mostly home cooked, rare fast food, rare fried foods; good mix proteins and vegetables  Exercise:  No regular exercise; works Architect so stays active  Labs: 7/21:  TC 179, TG 91, HDL 49, LDL 111   Current Outpatient Medications  Medication Sig Dispense Refill  . allopurinol (ZYLOPRIM) 300 MG tablet TAKE ONE TABLET BY MOUTH ONE TIME DAILY 90 tablet 2  . amLODipine (NORVASC) 5 MG tablet TAKE ONE TABLET BY MOUTH ONE TIME DAILY 90 tablet 3  . B-D UF III MINI PEN NEEDLES 31G X 5 MM MISC USE AS DIRECTED 100 each prn  . Blood Glucose Monitoring Suppl (ONETOUCH VERIO FLEX SYSTEM) w/Device KIT USE TO TEST BLOOD SUGAR 1 kit  prn  . buPROPion (WELLBUTRIN XL) 300 MG 24 hr tablet TAKE ONE TABLET BY MOUTH ONE TIME DAILY 90 tablet 0  . clonazePAM (KLONOPIN) 1 MG tablet Take 1 tablet (1 mg total) by mouth 2 (two) times daily as needed for anxiety. 90 tablet 0  . cyclobenzaprine (FLEXERIL) 10 MG tablet Take 1 tablet (10 mg total) by mouth at bedtime as needed for muscle spasms. 30 tablet 11  . fenofibrate 160 MG tablet TAKE ONE TABLET BY MOUTH ONE TIME DAILY 300 tablet 0  . hydrochlorothiazide (HYDRODIURIL) 25 MG tablet TAKE ONE TABLET BY MOUTH ONE TIME DAILY 90 tablet 2  . Insulin Glargine (LANTUS SOLOSTAR) 100 UNIT/ML Solostar Pen Inject 22 Units into the skin daily. 6 pen PRN  . JANUVIA 100 MG tablet TAKE ONE TABLET BY MOUTH ONE TIME DAILY 270 tablet 0  . metFORMIN (GLUCOPHAGE) 1000 MG tablet TAKE ONE TABLET BY MOUTH TWICE A DAY WITH MEALS 180 tablet 3  . metoprolol tartrate (LOPRESSOR) 100 MG tablet Take 1 tablet (100 mg total) by mouth once for 1 dose. Take two hours prior to cardiac CT 1 tablet 0  . Multiple Vitamin (MULTIVITAMIN) tablet Take 1 tablet by mouth daily.      Marland Kitchen olmesartan (BENICAR) 40 MG tablet TAKE ONE TABLET BY MOUTH ONE TIME DAILY 90 tablet 3  . tamsulosin (FLOMAX) 0.4 MG CAPS capsule TAKE ONE CAPSULE BY MOUTH  ONE TIME DAILY 270 capsule 0  . triamcinolone cream (KENALOG) 0.1 % Apply 1 application topically 3 (three) times daily. 30 g 1  . rosuvastatin (CRESTOR) 5 MG tablet Take 1 tablet (5 mg total) by mouth daily. 90 tablet 3   No current facility-administered medications for this visit.    No Known Allergies  Past Medical History:  Diagnosis Date  . BPH (benign prostatic hypertrophy)   . Depression   . ED (erectile dysfunction)     Blood pressure (!) 172/92, pulse 87, resp. rate 14, height 6' 1"  (1.854 m), weight 248 lb 9.6 oz (112.8 kg), SpO2 96 %.   Hyperlipidemia Patient with elevated LDL, currently not on maximum tolerated statin therapy.  Patient does not recall previous myalgias or  problems with previous statin and is currently taking rosuvastatin 5 mg only weekly.  Will have him increase dose to 5 mg daily and reach out to him in a month to see if he is tolerating.  Will consider bump to 10 mg daily at that time if doing well.  Repeat labs in 3 months.  Will continue to push rosuvastatin dose to max tolerated, then can consider addition of PCSK-9 inhibitor.     Tommy Medal PharmD CPP [facility] [address] [place], [place] [address] [phone]

## 2020-03-30 NOTE — Patient Instructions (Addendum)
Your Results:             Your most recent labs Goal  Total Cholesterol 179 < 200  Triglycerides 91 < 150  HDL (happy/good cholesterol) 49 > 40  LDL (lousy/bad cholesterol 111 < 70      Medication changes:  Increase rosuvastatin to 5 mg daily.  We will call you in about a month to see if you tolerate this.  If you develop muscle aches/pains before then, please call us at [phone] (Giancarlo Askren/Raquel)  Lab orders: We will repeat labs after 2-3 months.    Thank you for choosing [facility]

## 2020-04-03 ENCOUNTER — Encounter: Payer: Self-pay | Admitting: Pharmacist Clinician (PhC)/ Clinical Pharmacy Specialist

## 2020-04-03 DIAGNOSIS — E785 Hyperlipidemia, unspecified: Secondary | ICD-10-CM | POA: Insufficient documentation

## 2020-04-03 NOTE — Assessment & Plan Note (Signed)
Patient with elevated LDL, currently not on maximum tolerated statin therapy.  Patient does not recall previous myalgias or problems with previous statin and is currently taking rosuvastatin 5 mg only weekly.  Will have him increase dose to 5 mg daily and reach out to him in a month to see if he is tolerating.  Will consider bump to 10 mg daily at that time if doing well.  Repeat labs in 3 months.  Will continue to push rosuvastatin dose to max tolerated, then can consider addition of PCSK-9 inhibitor.

## 2020-04-09 ENCOUNTER — Other Ambulatory Visit: Payer: Self-pay

## 2020-04-09 MED ORDER — CLONAZEPAM 1 MG PO TABS: 1.0000 mg | ORAL_TABLET | Freq: Two times a day (BID) | ORAL | 0 refills | Status: DC | PRN

## 2020-04-09 MED ORDER — BUPROPION HCL ER (XL) 300 MG PO TB24: 300.0000 mg | ORAL_TABLET | Freq: Every day | ORAL | 0 refills | Status: DC

## 2020-04-09 NOTE — Progress Notes (Signed)
Cardiology Office Note:    Date:  04/10/2020   ID:  [name], DOB 1950-12-10, MRN [id number]  PCP:  Elby Showers, MD  Cardiologist:  Shirlee More, MD    Referring MD: Elby Showers, MD    ASSESSMENT:    1. Elevated coronary artery calcium score   2. Coronary artery disease of native artery of native heart with stable angina pectoris ([place])   3. Adrenal mass ([place])   4. Pure hypercholesterolemia   5. Essential hypertension    PLAN:    In order of problems listed above:  1. Calcium score severely elevated he has three-vessel CAD nonflow limiting.  Maximize therapy aspirin continue rosuvastatin daily if LDL greater 55 add Zetia to achieve goal. 2. We will have a follow-up dedicated CT adrenal glands 3. Recheck lipids I take statin daily if LDL greater than 55 add Zetia 4. BP at target continue current treatment   Next appointment: Months   Medication Adjustments/Labs and Tests Ordered: Current medicines are reviewed at length with the patient today.  Concerns regarding medicines are outlined above.  No orders of the defined types were placed in this encounter.  No orders of the defined types were placed in this encounter.   Chief Complaint  Patient presents with  . Follow-up    After cardiac CTA  . Hyperlipidemia    History of Present Illness:    [name] [name] is a 69 y.o. male with a hx of type 2 diabetes mellitus on insulin, hypertension, hyperlipidemia and chest pain last seen 01/28/2020 and referred for cardiac CTA.[name] [name] Compliance with diet, lifestyle and medications: Yes  I reviewed the CT results with him he is not taking a statin daily we will check a lipid profile if LDL is greater than 55 we will add Zetia.  He wants to avoid PCSK9 inhibitors due to cost.  He has had no further chest pain.  Had no order the adrenal protocol contrast CT of the abdomen recommended by radiology.  Given a prescription for nitroglycerin to use as needed and I will continue  to follow in the office.  Cardiac CT was performed 02/23/2020.  Noncardiac interpretation raises the question of incompletely visualized left adrenal mass with a recommendation for further CT contrast.  Cardiac findings included a very high calcium score 2708 97th percentile for age and sex severe calcification of the proximal left circumflex coronary artery cannot exclude severe greater than 70% stenosis, heavy calcification of all 3 coronary vessels with moderate CAD the right coronary artery minimal left main moderate in the LAD.  External flow reserve was normal in the left circumflex coronary artery left main LAD and right coronary artery.  His last lipid profile 07/30/2019 showed residual elevation in LDL cholesterol at 115 and non-HDL cholesterol 135 and high risk patient with diabetes and CAD. Past Medical History:  Diagnosis Date  . BPH (benign prostatic hypertrophy)   . Depression   . ED (erectile dysfunction)     Past Surgical History:  Procedure Laterality Date  . BACK SURGERY    . NECK SURGERY      Current Medications: Current Meds  Medication Sig  . allopurinol (ZYLOPRIM) 300 MG tablet TAKE ONE TABLET BY MOUTH ONE TIME DAILY  . amLODipine (NORVASC) 5 MG tablet TAKE ONE TABLET BY MOUTH ONE TIME DAILY  . B-D UF III MINI PEN NEEDLES 31G X 5 MM MISC USE AS DIRECTED  . Blood Glucose Monitoring Suppl ([place]) w/Device  KIT USE TO TEST BLOOD SUGAR  . buPROPion (WELLBUTRIN XL) 300 MG 24 hr tablet Take 1 tablet (300 mg total) by mouth daily.  . clonazePAM (KLONOPIN) 1 MG tablet Take 1 tablet (1 mg total) by mouth 2 (two) times daily as needed for anxiety.  . cyclobenzaprine (FLEXERIL) 10 MG tablet Take 1 tablet (10 mg total) by mouth at bedtime as needed for muscle spasms.  . fenofibrate 160 MG tablet TAKE ONE TABLET BY MOUTH ONE TIME DAILY  . hydrochlorothiazide (HYDRODIURIL) 25 MG tablet TAKE ONE TABLET BY MOUTH ONE TIME DAILY  . Insulin Glargine (LANTUS  SOLOSTAR) 100 UNIT/ML Solostar Pen Inject 22 Units into the skin daily.  [name] [name] JANUVIA 100 MG tablet TAKE ONE TABLET BY MOUTH ONE TIME DAILY  . metFORMIN (GLUCOPHAGE) 1000 MG tablet TAKE ONE TABLET BY MOUTH TWICE A DAY WITH MEALS  . Multiple Vitamin (MULTIVITAMIN) tablet Take 1 tablet by mouth daily.    [name] [name] olmesartan (BENICAR) 40 MG tablet TAKE ONE TABLET BY MOUTH ONE TIME DAILY  . rosuvastatin (CRESTOR) 5 MG tablet Take 1 tablet (5 mg total) by mouth daily.  . tamsulosin (FLOMAX) 0.4 MG CAPS capsule TAKE ONE CAPSULE BY MOUTH ONE TIME DAILY  . triamcinolone cream (KENALOG) 0.1 % Apply 1 application topically 3 (three) times daily.     Allergies:   Patient has no known allergies.   Social History   Socioeconomic History  . Marital status: Married    Spouse name: Not on file  . Number of children: Not on file  . Years of education: Not on file  . Highest education level: Not on file  Occupational History  . Not on file  Tobacco Use  . Smoking status: Former Smoker    Types: Cigarettes    Quit date: 12/27/1989    Years since quitting: 30.3  . Smokeless tobacco: Never Used  Substance and Sexual Activity  . Alcohol use: Yes    Alcohol/week: 6.0 - 9.0 standard drinks    Types: 6 - 9 Cans of beer per week    Comment: beer 6 pack per day   . Drug use: No  . Sexual activity: Not on file  Other Topics Concern  . Not on file  Social History Narrative  . Not on file   Social Determinants of Health   Financial Resource Strain:   . Difficulty of Paying Living Expenses: Not on file  Food Insecurity:   . Worried About Charity fundraiser in the Last Year: Not on file  . Ran Out of Food in the Last Year: Not on file  Transportation Needs:   . Lack of Transportation (Medical): Not on file  . Lack of Transportation (Non-Medical): Not on file  Physical Activity:   . Days of Exercise per Week: Not on file  . Minutes of Exercise per Session: Not on file  Stress:   . Feeling of Stress : Not  on file  Social Connections:   . Frequency of Communication with Friends and Family: Not on file  . Frequency of Social Gatherings with Friends and Family: Not on file  . Attends Religious Services: Not on file  . Active Member of Clubs or Organizations: Not on file  . Attends Archivist Meetings: Not on file  . Marital Status: Not on file     Family History: The patient's family history includes Cancer in his father and mother; Stomach cancer in his mother. There is no history of Colon cancer.  ROS:   Please see the history of present illness.    All other systems reviewed and are negative.  EKGs/Labs/Other Studies Reviewed:    The following studies were reviewed today:    Recent Labs: 01/25/2020: ALT 21; Hemoglobin 14.1; Platelets 223 02/18/2020: BUN 12; Creatinine, Ser 0.97; Potassium 4.7; Sodium 133  Recent Lipid Panel    Component Value Date/Time   CHOL 179 01/25/2020 1218   TRIG 91 01/25/2020 1218   HDL 49 01/25/2020 1218   CHOLHDL 3.7 01/25/2020 1218   VLDL 26 10/24/2016 1222   LDLCALC 111 (H) 01/25/2020 1218    Physical Exam:    VS:  BP 136/64   Pulse 84   Ht 6' 1" (1.854 m)   Wt 247 lb (112 kg)   SpO2 96%   BMI 32.59 kg/m     Wt Readings from Last 3 Encounters:  04/10/20 247 lb (112 kg)  03/30/20 248 lb 9.6 oz (112.8 kg)  01/28/20 246 lb (111.6 kg)     GEN:  Well nourished, well developed in no acute distress HEENT: Normal NECK: No JVD; No carotid bruits LYMPHATICS: No lymphadenopathy CARDIAC: RRR, no murmurs, rubs, gallops RESPIRATORY:  Clear to auscultation without rales, wheezing or rhonchi  ABDOMEN: Soft, non-tender, non-distended MUSCULOSKELETAL:  No edema; No deformity  SKIN: Warm and dry NEUROLOGIC:  Alert and oriented x 3 PSYCHIATRIC:  Normal affect    Signed, Shirlee More, MD  04/10/2020 1:39 PM    [facility]

## 2020-04-10 ENCOUNTER — Encounter: Payer: Self-pay | Admitting: Cardiology

## 2020-04-10 ENCOUNTER — Ambulatory Visit: Payer: PPO | Admitting: Cardiology

## 2020-04-10 ENCOUNTER — Other Ambulatory Visit: Payer: Self-pay

## 2020-04-10 VITALS — BP 136/64 | HR 84 | Ht 73.0 in | Wt 247.0 lb

## 2020-04-10 DIAGNOSIS — E278 Other specified disorders of adrenal gland: Secondary | ICD-10-CM | POA: Diagnosis not present

## 2020-04-10 DIAGNOSIS — R931 Abnormal findings on diagnostic imaging of heart and coronary circulation: Secondary | ICD-10-CM | POA: Diagnosis not present

## 2020-04-10 DIAGNOSIS — I10 Essential (primary) hypertension: Secondary | ICD-10-CM | POA: Diagnosis not present

## 2020-04-10 DIAGNOSIS — I25118 Atherosclerotic heart disease of native coronary artery with other forms of angina pectoris: Secondary | ICD-10-CM | POA: Diagnosis not present

## 2020-04-10 DIAGNOSIS — E7800 Pure hypercholesterolemia, unspecified: Secondary | ICD-10-CM | POA: Diagnosis not present

## 2020-04-10 MED ORDER — NITROGLYCERIN 0.4 MG SL SUBL
0.4000 mg | SUBLINGUAL_TABLET | SUBLINGUAL | 3 refills | Status: DC | PRN
Start: 2020-04-10 — End: 2022-08-15

## 2020-04-10 NOTE — Patient Instructions (Addendum)
Medication Instructions:  Your physician has recommended you make the following change in your medication: START: Nitroglycerin 0.4 mg take one tablet by mouth every 5 minutes up to three times as needed for chest pain.  *If you need a refill on your cardiac medications before your next appointment, please call your pharmacy*   Lab Work: Your physician recommends that you return for lab work in: [place], Lipids If you have labs (blood work) drawn today and your tests are completely normal, you will receive your results only by: Marland Kitchen MyChart Message (if you have MyChart) OR . A paper copy in the mail If you have any lab test that is abnormal or we need to change your treatment, we will call you to review the results.   Testing/Procedures: We have put in the order for you to have the CT of your abdomen completed. They will call you to schedule this appointment.    Follow-Up: At [facility], you and your health needs are our priority.  As part of our continuing mission to provide you with exceptional heart care, we have created designated Provider Care Teams.  These Care Teams include your primary Cardiologist (physician) and Advanced Practice Providers (APPs -  Physician Assistants and Nurse Practitioners) who all work together to provide you with the care you need, when you need it.  We recommend signing up for the patient portal called "MyChart".  Sign up information is provided on this After Visit Summary.  MyChart is used to connect with patients for Virtual Visits (Telemedicine).  Patients are able to view lab/test results, encounter notes, upcoming appointments, etc.  Non-urgent messages can be sent to your provider as well.   To learn more about what you can do with MyChart, go to [email or web address].    Your next appointment:   6 month(s)  The format for your next appointment:   In Person  Provider:   Shirlee More, MD   Other Instructions

## 2020-04-11 LAB — COMPREHENSIVE METABOLIC PANEL
ALT: 27 IU/L (ref 0–44)
AST: 26 IU/L (ref 0–40)
Albumin/Globulin Ratio: 2.4 — ABNORMAL HIGH (ref 1.2–2.2)
Albumin: 4.7 g/dL (ref 3.8–4.8)
Alkaline Phosphatase: 24 IU/L — ABNORMAL LOW (ref 44–121)
BUN/Creatinine Ratio: 13 (ref 10–24)
BUN: 13 mg/dL (ref 8–27)
Bilirubin Total: 0.4 mg/dL (ref 0.0–1.2)
CO2: 27 mmol/L (ref 20–29)
Calcium: 10 mg/dL (ref 8.6–10.2)
Chloride: 95 mmol/L — ABNORMAL LOW (ref 96–106)
Creatinine, Ser: 0.98 mg/dL (ref 0.76–1.27)
GFR calc Af Amer: 91 mL/min/{1.73_m2} (ref >59)
GFR calc non Af Amer: 79 mL/min/{1.73_m2} (ref >59)
Globulin, Total: 2 g/dL (ref 1.5–4.5)
Glucose: 156 mg/dL — ABNORMAL HIGH (ref 65–99)
Potassium: 4 mmol/L (ref 3.5–5.2)
Sodium: 132 mmol/L — ABNORMAL LOW (ref 134–144)
Total Protein: 6.7 g/dL (ref 6.0–8.5)

## 2020-04-11 LAB — LIPID PANEL
Chol/HDL Ratio: 3.4 ratio (ref 0.0–5.0)
Cholesterol, Total: 128 mg/dL (ref 100–199)
HDL: 38 mg/dL — ABNORMAL LOW (ref >39)
LDL Chol Calc (NIH): 63 mg/dL (ref 0–99)
Triglycerides: 156 mg/dL — ABNORMAL HIGH (ref 0–149)
VLDL Cholesterol Cal: 27 mg/dL (ref 5–40)

## 2020-04-15 ENCOUNTER — Other Ambulatory Visit: Payer: Self-pay | Admitting: Internal Medicine

## 2020-04-26 ENCOUNTER — Telehealth: Payer: Self-pay | Admitting: Cardiology

## 2020-04-26 ENCOUNTER — Telehealth: Payer: Self-pay

## 2020-04-26 ENCOUNTER — Other Ambulatory Visit: Payer: Self-pay | Admitting: Internal Medicine

## 2020-04-26 NOTE — Telephone Encounter (Signed)
Spoke with patient regarding results and recommendation.  Patient verbalizes understanding and is agreeable to plan of care. Advised patient to call back with any issues or concerns.  

## 2020-04-26 NOTE — Telephone Encounter (Signed)
-----   Message from Richardo Priest, MD sent at 04/26/2020  1:52 PM EDT ----- Good result flow measurement is normal I would not advise heart catheterization.

## 2020-04-26 NOTE — Telephone Encounter (Signed)
Follow Up:     Pt is returning your call. 

## 2020-04-26 NOTE — Telephone Encounter (Signed)
Left message on patients voicemail to please return our call.   

## 2020-05-03 ENCOUNTER — Other Ambulatory Visit: Payer: Self-pay

## 2020-05-03 DIAGNOSIS — E278 Other specified disorders of adrenal gland: Secondary | ICD-10-CM

## 2020-05-09 ENCOUNTER — Ambulatory Visit (HOSPITAL_BASED_OUTPATIENT_CLINIC_OR_DEPARTMENT_OTHER)
Admission: RE | Admit: 2020-05-09 | Discharge: 2020-05-09 | Disposition: A | Discharge status: Home / Self Care | Payer: PPO | Source: Ambulatory Visit | Attending: Cardiology | Admitting: Cardiology

## 2020-05-09 ENCOUNTER — Encounter (HOSPITAL_BASED_OUTPATIENT_CLINIC_OR_DEPARTMENT_OTHER): Payer: Self-pay

## 2020-05-09 ENCOUNTER — Other Ambulatory Visit: Payer: Self-pay

## 2020-05-09 DIAGNOSIS — N200 Calculus of kidney: Secondary | ICD-10-CM | POA: Diagnosis not present

## 2020-05-09 DIAGNOSIS — I700 Atherosclerosis of aorta: Secondary | ICD-10-CM | POA: Diagnosis not present

## 2020-05-09 DIAGNOSIS — I2510 Atherosclerotic heart disease of native coronary artery without angina pectoris: Secondary | ICD-10-CM | POA: Diagnosis not present

## 2020-05-09 DIAGNOSIS — E278 Other specified disorders of adrenal gland: Secondary | ICD-10-CM | POA: Insufficient documentation

## 2020-05-09 MED ORDER — IOHEXOL 300 MG/ML  SOLN
100.0000 mL | Freq: Once | INTRAMUSCULAR | Status: AC | PRN
  Administered 2020-05-09: 100 mL via INTRAVENOUS

## 2020-05-10 ENCOUNTER — Telehealth: Payer: Self-pay

## 2020-05-10 NOTE — Telephone Encounter (Signed)
Left message on patients voicemail to please return our call.   

## 2020-05-11 NOTE — Telephone Encounter (Signed)
Spoke with patient regarding results and recommendation.  Patient verbalizes understanding and is agreeable to plan of care. Advised patient to call back with any issues or concerns.  

## 2020-05-26 ENCOUNTER — Other Ambulatory Visit: Payer: Self-pay | Admitting: Internal Medicine

## 2020-06-02 ENCOUNTER — Other Ambulatory Visit: Payer: Self-pay

## 2020-06-02 MED ORDER — TRIAMCINOLONE ACETONIDE 0.1 % EX CREA: 1.0000 "application " | TOPICAL_CREAM | Freq: Three times a day (TID) | CUTANEOUS | 1 refills | Status: DC

## 2020-06-30 DIAGNOSIS — M7541 Impingement syndrome of right shoulder: Secondary | ICD-10-CM | POA: Diagnosis not present

## 2020-08-11 DIAGNOSIS — M7541 Impingement syndrome of right shoulder: Secondary | ICD-10-CM | POA: Diagnosis not present

## 2020-08-12 ENCOUNTER — Other Ambulatory Visit: Payer: Self-pay | Admitting: Internal Medicine

## 2020-08-24 ENCOUNTER — Other Ambulatory Visit: Payer: Self-pay | Admitting: Internal Medicine

## 2020-10-09 ENCOUNTER — Other Ambulatory Visit: Payer: Self-pay | Admitting: Internal Medicine

## 2020-10-15 ENCOUNTER — Other Ambulatory Visit: Payer: Self-pay | Admitting: Internal Medicine

## 2020-10-31 ENCOUNTER — Other Ambulatory Visit: Payer: Self-pay | Admitting: Internal Medicine

## 2020-11-05 ENCOUNTER — Other Ambulatory Visit: Payer: Self-pay | Admitting: Internal Medicine

## 2021-01-07 ENCOUNTER — Other Ambulatory Visit: Payer: Self-pay | Admitting: Internal Medicine

## 2021-01-19 LAB — HM DIABETES EYE EXAM

## 2021-01-22 DIAGNOSIS — E119 Type 2 diabetes mellitus without complications: Secondary | ICD-10-CM | POA: Diagnosis not present

## 2021-01-23 ENCOUNTER — Encounter: Payer: Self-pay | Admitting: Internal Medicine

## 2021-01-23 DIAGNOSIS — L738 Other specified follicular disorders: Secondary | ICD-10-CM | POA: Diagnosis not present

## 2021-01-23 DIAGNOSIS — D485 Neoplasm of uncertain behavior of skin: Secondary | ICD-10-CM | POA: Diagnosis not present

## 2021-01-23 DIAGNOSIS — L570 Actinic keratosis: Secondary | ICD-10-CM | POA: Diagnosis not present

## 2021-01-23 DIAGNOSIS — Z85828 Personal history of other malignant neoplasm of skin: Secondary | ICD-10-CM | POA: Diagnosis not present

## 2021-01-23 DIAGNOSIS — L821 Other seborrheic keratosis: Secondary | ICD-10-CM | POA: Diagnosis not present

## 2021-01-23 DIAGNOSIS — B078 Other viral warts: Secondary | ICD-10-CM | POA: Diagnosis not present

## 2021-01-29 ENCOUNTER — Other Ambulatory Visit: Payer: Self-pay | Admitting: Internal Medicine

## 2021-01-30 ENCOUNTER — Other Ambulatory Visit: Payer: Self-pay

## 2021-01-30 ENCOUNTER — Other Ambulatory Visit: Payer: PPO | Admitting: Internal Medicine

## 2021-01-30 DIAGNOSIS — N401 Enlarged prostate with lower urinary tract symptoms: Secondary | ICD-10-CM | POA: Diagnosis not present

## 2021-01-30 DIAGNOSIS — I10 Essential (primary) hypertension: Secondary | ICD-10-CM

## 2021-01-30 DIAGNOSIS — F439 Reaction to severe stress, unspecified: Secondary | ICD-10-CM

## 2021-01-30 DIAGNOSIS — F419 Anxiety disorder, unspecified: Secondary | ICD-10-CM | POA: Diagnosis not present

## 2021-01-30 DIAGNOSIS — E8881 Metabolic syndrome: Secondary | ICD-10-CM | POA: Diagnosis not present

## 2021-01-30 DIAGNOSIS — Z6833 Body mass index (BMI) 33.0-33.9, adult: Secondary | ICD-10-CM

## 2021-01-30 DIAGNOSIS — R351 Nocturia: Secondary | ICD-10-CM

## 2021-01-30 DIAGNOSIS — E1169 Type 2 diabetes mellitus with other specified complication: Secondary | ICD-10-CM | POA: Diagnosis not present

## 2021-01-30 DIAGNOSIS — Z0000 Encounter for general adult medical examination without abnormal findings: Secondary | ICD-10-CM

## 2021-01-30 DIAGNOSIS — E785 Hyperlipidemia, unspecified: Secondary | ICD-10-CM

## 2021-01-31 LAB — CBC WITH DIFFERENTIAL/PLATELET
Absolute Monocytes: 576 cells/uL (ref 200–950)
Basophils Absolute: 67 cells/uL (ref 0–200)
Basophils Relative: 1 %
Eosinophils Absolute: 121 cells/uL (ref 15–500)
Eosinophils Relative: 1.8 %
HCT: 43.6 % (ref 38.5–50.0)
Hemoglobin: 14.4 g/dL (ref 13.2–17.1)
Lymphs Abs: 1916 cells/uL (ref 850–3900)
MCH: 30.7 pg (ref 27.0–33.0)
MCHC: 33 g/dL (ref 32.0–36.0)
MCV: 93 fL (ref 80.0–100.0)
MPV: 11.2 fL (ref 7.5–12.5)
Monocytes Relative: 8.6 %
Neutro Abs: 4020 cells/uL (ref 1500–7800)
Neutrophils Relative %: 60 %
Platelets: 210 10*3/uL (ref 140–400)
RBC: 4.69 10*6/uL (ref 4.20–5.80)
RDW: 13 % (ref 11.0–15.0)
Total Lymphocyte: 28.6 %
WBC: 6.7 10*3/uL (ref 3.8–10.8)

## 2021-01-31 LAB — COMPLETE METABOLIC PANEL WITH GFR
AG Ratio: 2 (calc) (ref 1.0–2.5)
ALT: 22 U/L (ref 9–46)
AST: 22 U/L (ref 10–35)
Albumin: 4.5 g/dL (ref 3.6–5.1)
Alkaline phosphatase (APISO): 20 U/L — ABNORMAL LOW (ref 35–144)
BUN: 11 mg/dL (ref 7–25)
CO2: 28 mmol/L (ref 20–32)
Calcium: 9.9 mg/dL (ref 8.6–10.3)
Chloride: 99 mmol/L (ref 98–110)
Creat: 0.96 mg/dL (ref 0.70–1.35)
Globulin: 2.3 g/dL (calc) (ref 1.9–3.7)
Glucose, Bld: 107 mg/dL — ABNORMAL HIGH (ref 65–99)
Potassium: 4.4 mmol/L (ref 3.5–5.3)
Sodium: 135 mmol/L (ref 135–146)
Total Bilirubin: 0.7 mg/dL (ref 0.2–1.2)
Total Protein: 6.8 g/dL (ref 6.1–8.1)
eGFR: 86 mL/min/{1.73_m2} (ref >=60)

## 2021-01-31 LAB — LIPID PANEL
Cholesterol: 140 mg/dL (ref <200)
HDL: 46 mg/dL (ref >=40)
LDL Cholesterol (Calc): 76 mg/dL (calc)
Non-HDL Cholesterol (Calc): 94 mg/dL (calc) (ref <130)
Total CHOL/HDL Ratio: 3 (calc) (ref <5.0)
Triglycerides: 101 mg/dL (ref <150)

## 2021-01-31 LAB — MICROALBUMIN / CREATININE URINE RATIO
Creatinine, Urine: 68 mg/dL (ref 20–320)
Microalb Creat Ratio: 13 mcg/mg creat (ref <30)
Microalb, Ur: 0.9 mg/dL

## 2021-01-31 LAB — HEMOGLOBIN A1C
Hgb A1c MFr Bld: 6.1 % of total Hgb — ABNORMAL HIGH (ref <5.7)
Mean Plasma Glucose: 128 mg/dL
eAG (mmol/L): 7.1 mmol/L

## 2021-01-31 LAB — PSA: PSA: 3.74 ng/mL (ref <=4.00)

## 2021-02-02 ENCOUNTER — Other Ambulatory Visit: Payer: Self-pay | Admitting: Internal Medicine

## 2021-02-02 ENCOUNTER — Encounter: Payer: Self-pay | Admitting: Internal Medicine

## 2021-02-02 ENCOUNTER — Other Ambulatory Visit: Payer: Self-pay

## 2021-02-02 ENCOUNTER — Ambulatory Visit (INDEPENDENT_AMBULATORY_CARE_PROVIDER_SITE_OTHER): Payer: PPO | Admitting: Internal Medicine

## 2021-02-02 VITALS — BP 130/80 | HR 84 | Ht 73.0 in | Wt 250.0 lb

## 2021-02-02 DIAGNOSIS — Z0000 Encounter for general adult medical examination without abnormal findings: Secondary | ICD-10-CM | POA: Diagnosis not present

## 2021-02-02 DIAGNOSIS — E1169 Type 2 diabetes mellitus with other specified complication: Secondary | ICD-10-CM

## 2021-02-02 DIAGNOSIS — N401 Enlarged prostate with lower urinary tract symptoms: Secondary | ICD-10-CM

## 2021-02-02 DIAGNOSIS — I10 Essential (primary) hypertension: Secondary | ICD-10-CM

## 2021-02-02 DIAGNOSIS — E119 Type 2 diabetes mellitus without complications: Secondary | ICD-10-CM | POA: Diagnosis not present

## 2021-02-02 DIAGNOSIS — R351 Nocturia: Secondary | ICD-10-CM

## 2021-02-02 DIAGNOSIS — Z8659 Personal history of other mental and behavioral disorders: Secondary | ICD-10-CM | POA: Diagnosis not present

## 2021-02-02 DIAGNOSIS — E785 Hyperlipidemia, unspecified: Secondary | ICD-10-CM | POA: Diagnosis not present

## 2021-02-02 DIAGNOSIS — E8881 Metabolic syndrome: Secondary | ICD-10-CM

## 2021-02-02 DIAGNOSIS — Z6832 Body mass index (BMI) 32.0-32.9, adult: Secondary | ICD-10-CM | POA: Diagnosis not present

## 2021-02-02 DIAGNOSIS — Z86010 Personal history of colonic polyps: Secondary | ICD-10-CM

## 2021-02-02 LAB — POCT URINALYSIS DIPSTICK
Appearance: NEGATIVE
Bilirubin, UA: NEGATIVE
Blood, UA: NEGATIVE
Glucose, UA: NEGATIVE
Ketones, UA: NEGATIVE
Leukocytes, UA: NEGATIVE
Nitrite, UA: NEGATIVE
Odor: NEGATIVE
Protein, UA: NEGATIVE
Spec Grav, UA: 1.01 (ref 1.010–1.025)
Urobilinogen, UA: 0.2 E.U./dL
pH, UA: 8 (ref 5.0–8.0)

## 2021-02-02 MED ORDER — CLONAZEPAM 1 MG PO TABS: 1.0000 mg | ORAL_TABLET | Freq: Two times a day (BID) | ORAL | 0 refills | Status: DC | PRN

## 2021-02-02 NOTE — Progress Notes (Signed)
Subjective:    Patient ID: [name] [name], male    DOB: 1951-01-22, 70 y.o.   MRN: [id number]  HPI 70 year old Male seen for Medicare wellness, health maintenance exam and evaluation of medical issues.  He has a history of diabetes mellitus and hypertension.  He has anxiety and depression.  History of gout.  History of hyperlipidemia.  He is compliant with his medications.  He exercises by playing golf and also with his work as a Hydrologist.  History of obesity and metabolic syndrome.  History of back pain status post L2-L3 discectomy March 2014 with right lower extremity radiculopathy.  Colonoscopy done in 2010 with history of adenomatous polyps.  And repeat study in 2016 for tubular adenomas removed and 2 hyperplastic polyps.  Past due for follow-up.  3-year follow-up was recommended.  Reminded about annual diabetic eye exam.  History of degenerative arthritis AC joint of right shoulder 2007 seen by Dr. Suszanne Conners.  In March 1999 he had a partial tear of gastrocnemius muscle in the left leg treated by Dr. Sharol Given.  He had a cyst removed from his spine in 1977 and it actually may have been a pilonidal cyst.  History of lumbar disc disease L4-L5 and L5-S1 in 1998.  He presented with lumbar radiculopathy at that time.  He received epidural steroids x3 with improvement.  Social history: He is married.  First wife died of cancer.  This is his second marriage.  Completed 1 year of college.  Enjoys golf.  Does not smoke.  Drinks beer.  2 adult children, a son and a daughter.  He is a self-employed Hydrologist.  Family history: Mother died of stomach cancer at age 60.  Father died of lung cancer at age 53.  His parents died within 68 months of each other when he was 25 or 66 years old.  3 sisters in good health.     Review of Systems  Constitutional: Negative.   Respiratory: Negative.    Cardiovascular: Negative.   Gastrointestinal: Negative.   Genitourinary: Negative.    Neurological: Negative.   Psychiatric/Behavioral: Negative.        Objective:   Physical Exam Vitals reviewed.  Constitutional:      Appearance: Normal appearance. He is obese.  HENT:     Head: Normocephalic and atraumatic.     Right Ear: Tympanic membrane normal.     Left Ear: Tympanic membrane normal.     Nose: Nose normal.     Mouth/Throat:     Pharynx: Oropharynx is clear.  Eyes:     General: No scleral icterus.    Extraocular Movements: Extraocular movements intact.     Conjunctiva/sclera: Conjunctivae normal.     Pupils: Pupils are equal, round, and reactive to light.  Neck:     Vascular: No carotid bruit.  Cardiovascular:     Rate and Rhythm: Normal rate and regular rhythm.     Heart sounds: Normal heart sounds. No murmur heard. Pulmonary:     Effort: Pulmonary effort is normal.     Breath sounds: No wheezing or rales.  Abdominal:     Palpations: Abdomen is soft. There is no mass.     Tenderness: There is no guarding or rebound.     Hernia: No hernia is present.  Genitourinary:    Prostate: Normal.  Musculoskeletal:        General: No deformity.     Cervical back: Neck supple. No rigidity.     Right  lower leg: No edema.     Left lower leg: No edema.  Lymphadenopathy:     Cervical: No cervical adenopathy.  Skin:    General: Skin is warm and dry.  Neurological:     General: No focal deficit present.     Mental Status: He is alert and oriented to person, place, and time.     Cranial Nerves: No cranial nerve deficit.     Motor: No weakness.     Coordination: Coordination normal.     Gait: Gait normal.  Psychiatric:        Mood and Affect: Mood normal.        Behavior: Behavior normal.        Thought Content: Thought content normal.        Judgment: Judgment normal.          Assessment & Plan:  Type type 2 diabetes mellitus treated with Januvia and metformin.  Hemoglobin A1c stable at 6.1%.  Previously was 5.8% in July 2021.  Continue to watch diet  and try to get a fair amount of exercise.  BPH treated with Flomax  Essential hypertension-blood pressure normal on olmesartan, HCTZ, and amlodipine  Anxiety depression treated with Klonopin and Wellbutrin  Mild hyperlipidemia treated with Crestor 5 mg daily.  Lipid panel is normal.  He also takes fenofibrate 160 mg daily for hypertriglyceridemia.  BMI 32-continue diet and exercise efforts.   Subjective:   Patient presents for Medicare Annual/Subsequent preventive examination.  Review Past Medical/Family/Social: See above   Risk Factors  Current exercise habits: Light but physically active with his work Dietary issues discussed: Low-fat low carbohydrate discussed  Cardiac risk factors: Hyperlipidemia and diabetes mellitus  Depression Screen  (Note: if answer to either of the following is "Yes", a more complete depression screening is indicated)   Over the past two weeks, have you felt down, depressed or hopeless? No  Over the past two weeks, have you felt little interest or pleasure in doing things? No Have you lost interest or pleasure in daily life? No Do you often feel hopeless? No Do you cry easily over simple problems? No   Activities of Daily Living  In your present state of health, do you have any difficulty performing the following activities?:   Driving? No  Managing money? No  Feeding yourself? No  Getting from bed to chair? No  Climbing a flight of stairs? No  Preparing food and eating?: No  Bathing or showering? No  Getting dressed: No  Getting to the toilet? No  Using the toilet:No  Moving around from place to place: No  In the past year have you fallen or had a near fall?:No  Are you sexually active? No  Do you have more than one partner? No   Hearing Difficulties: No  Do you often ask people to speak up or repeat themselves? No  Do you experience ringing or noises in your ears? No  Do you have difficulty understanding soft or whispered voices? No   Do you feel that you have a problem with memory? No Do you often misplace items? No    Home Safety:  Do you have a smoke alarm at your residence? Yes Do you have grab bars in the bathroom?  No Do you have throw rugs in your house?  None   Cognitive Testing  Alert? Yes Normal Appearance?Yes  Oriented to person? Yes Place? Yes  Time? Yes  Recall of three objects? Yes  Can perform  simple calculations? Yes  Displays appropriate judgment?Yes  Can read the correct time from a watch face?Yes   List the Names of Other Physician/Practitioners you currently use:  See referral list for the physicians patient is currently seeing.   Dr. Havery Moros is Gastroenterologist  Review of Systems: See above   Objective:     General appearance: Appears stated age and mildly obese  Head: Normocephalic, without obvious abnormality, atraumatic  Eyes: conj clear, EOMi PEERLA  Ears: normal TM's and external ear canals both ears  Nose: Nares normal. Septum midline. Mucosa normal. No drainage or sinus tenderness.  Throat: lips, mucosa, and tongue normal; teeth and gums normal  Neck: no adenopathy, no carotid bruit, no JVD, supple, symmetrical, trachea midline and thyroid not enlarged, symmetric, no tenderness/mass/nodules  No CVA tenderness.  Lungs: clear to auscultation bilaterally  Breasts: normal appearance, no masses or tenderness Heart: regular rate and rhythm, S1, S2 normal, no murmur, click, rub or gallop  Abdomen: soft, non-tender; bowel sounds normal; no masses, no organomegaly  Musculoskeletal: ROM normal in all joints, no crepitus, no deformity, Normal muscle strengthen. Back  is symmetric, no curvature. Skin: Skin color, texture, turgor normal. No rashes or lesions  Lymph nodes: Cervical, supraclavicular, and axillary nodes normal.  Neurologic: CN 2 -12 Normal, Normal symmetric reflexes. Normal coordination and gait  Psych: Alert & Oriented x 3, Mood appear stable.    Assessment:     Annual wellness medicare exam   Plan:    During the course of the visit the patient was educated and counseled about appropriate screening and preventive services including:   Tetanus immunization is up-to-date.  He has had 3 COVID vaccines by our records.  He has had pneumococcal vaccines.  Gets annual flu vaccine.  Needs colonoscopy     Patient Instructions (the written plan) was given to the patient.  Medicare Attestation  I have personally reviewed:  The patient's medical and social history  Their use of alcohol, tobacco or illicit drugs  Their current medications and supplements  The patient's functional ability including ADLs,fall risks, home safety risks, cognitive, and hearing and visual impairment  Diet and physical activities  Evidence for depression or mood disorders  The patient's weight, height, BMI, and visual acuity have been recorded in the chart. I have made referrals, counseling, and provided education to the patient based on review of the above and I have provided the patient with a written personalized care plan for preventive services.

## 2021-02-03 LAB — MICROALBUMIN / CREATININE URINE RATIO
Creatinine, Urine: 93 mg/dL (ref 20–320)
Microalb Creat Ratio: 5 mcg/mg creat (ref <30)
Microalb, Ur: 0.5 mg/dL

## 2021-03-13 NOTE — Patient Instructions (Signed)
Colonoscopy is due.  Referral will be made to Dr. Havery Moros.  Have COVID booster in the Fall.  Continue current medications and follow-up here in 6 months.  It was a pleasure to see you today.

## 2021-03-14 ENCOUNTER — Other Ambulatory Visit: Payer: Self-pay

## 2021-03-14 DIAGNOSIS — Z1211 Encounter for screening for malignant neoplasm of colon: Secondary | ICD-10-CM

## 2021-03-16 ENCOUNTER — Other Ambulatory Visit: Payer: Self-pay

## 2021-03-16 MED ORDER — CLONAZEPAM 1 MG PO TABS: 1.0000 mg | ORAL_TABLET | Freq: Two times a day (BID) | ORAL | 1 refills | Status: DC | PRN

## 2021-03-20 ENCOUNTER — Other Ambulatory Visit: Payer: Self-pay | Admitting: Internal Medicine

## 2021-03-25 ENCOUNTER — Other Ambulatory Visit: Payer: Self-pay | Admitting: Internal Medicine

## 2021-04-07 ENCOUNTER — Other Ambulatory Visit: Payer: Self-pay | Admitting: Internal Medicine

## 2021-05-04 ENCOUNTER — Encounter: Payer: Self-pay | Admitting: Gastroenterology

## 2021-06-21 ENCOUNTER — Ambulatory Visit (AMBULATORY_SURGERY_CENTER): Payer: Self-pay | Admitting: *Deleted

## 2021-06-21 ENCOUNTER — Encounter: Payer: Self-pay | Admitting: Gastroenterology

## 2021-06-21 ENCOUNTER — Other Ambulatory Visit: Payer: Self-pay

## 2021-06-21 VITALS — Ht 73.0 in | Wt 239.0 lb

## 2021-06-21 DIAGNOSIS — Z86010 Personal history of colonic polyps: Secondary | ICD-10-CM

## 2021-06-21 MED ORDER — NA SULFATE-K SULFATE-MG SULF 17.5-3.13-1.6 GM/177ML PO SOLN: 1.0000 | ORAL | 0 refills | Status: DC

## 2021-06-21 NOTE — Progress Notes (Signed)
Patient is here in-person for PV. Patient denies any allergies to eggs or soy. Patient denies any problems with anesthesia/sedation. "Hard t wake up" per pt. Patient is not on any oxygen at home. Patient is not taking any diet/weight loss medications or blood thinners. Patient is aware of our care-partner policy and [id number] safety protocol.   EMMI education assigned to the patient for the procedure, sent to [facility].   Patient is COVID-19 vaccinated.

## 2021-07-03 ENCOUNTER — Telehealth: Payer: Self-pay | Admitting: Gastroenterology

## 2021-07-03 ENCOUNTER — Encounter: Payer: PPO | Admitting: Gastroenterology

## 2021-07-03 NOTE — Telephone Encounter (Signed)
Hey Dr. Havery Moros,   Call from patient to cancel procedure for today 12/20. States he have a fever and very sick, believes he have the flu. Patient rescheduled for 1/13.  Thank you

## 2021-07-03 NOTE — Telephone Encounter (Signed)
Okay thanks for letting me know

## 2021-07-06 ENCOUNTER — Other Ambulatory Visit: Payer: Self-pay | Admitting: Internal Medicine

## 2021-07-27 ENCOUNTER — Other Ambulatory Visit: Payer: Self-pay

## 2021-07-27 ENCOUNTER — Ambulatory Visit (AMBULATORY_SURGERY_CENTER): Payer: PPO | Admitting: Gastroenterology

## 2021-07-27 ENCOUNTER — Encounter: Payer: Self-pay | Admitting: Gastroenterology

## 2021-07-27 VITALS — BP 162/71 | HR 69 | Temp 98.4°F | Resp 12 | Ht 73.0 in | Wt 239.0 lb

## 2021-07-27 DIAGNOSIS — F329 Major depressive disorder, single episode, unspecified: Secondary | ICD-10-CM | POA: Diagnosis not present

## 2021-07-27 DIAGNOSIS — I10 Essential (primary) hypertension: Secondary | ICD-10-CM | POA: Diagnosis not present

## 2021-07-27 DIAGNOSIS — E119 Type 2 diabetes mellitus without complications: Secondary | ICD-10-CM | POA: Diagnosis not present

## 2021-07-27 DIAGNOSIS — Z86010 Personal history of colonic polyps: Secondary | ICD-10-CM

## 2021-07-27 DIAGNOSIS — D122 Benign neoplasm of ascending colon: Secondary | ICD-10-CM

## 2021-07-27 MED ORDER — SODIUM CHLORIDE 0.9 % IV SOLN
500.0000 mL | Freq: Once | INTRAVENOUS | Status: DC
Start: 2021-07-27 — End: 2021-07-27

## 2021-07-27 NOTE — Op Note (Signed)
[facility] Patient Name: [name] [name] Procedure Date: 07/27/2021 8:33 AM MRN: [id number] Endoscopist: Remo Lipps P. Havery Moros , MD Age: 71 Referring MD:  Date of Birth: 1950-09-18 Gender: Male Account #: [id number] Procedure:                Colonoscopy Indications:              High risk colon cancer surveillance: Personal                            history of colonic polyps - multiple polyps                            removed, 4 adenomas 04/2015 Medicines:                Monitored Anesthesia Care Procedure:                Pre-Anesthesia Assessment:                           - Prior to the procedure, a History and Physical                            was performed, and patient medications and                            allergies were reviewed. The patient's tolerance of                            previous anesthesia was also reviewed. The risks                            and benefits of the procedure and the sedation                            options and risks were discussed with the patient.                            All questions were answered, and informed consent                            was obtained. Prior Anticoagulants: The patient has                            taken no previous anticoagulant or antiplatelet                            agents. ASA Grade Assessment: II - A patient with                            mild systemic disease. After reviewing the risks                            and benefits, the patient was deemed in  satisfactory condition to undergo the procedure.                           After obtaining informed consent, the colonoscope                            was passed under direct vision. Throughout the                            procedure, the patient's blood pressure, pulse, and                            oxygen saturations were monitored continuously. The                            Colonoscope was introduced through the  anus and                            advanced to the the cecum, identified by                            appendiceal orifice and ileocecal valve. The                            colonoscopy was performed without difficulty. The                            patient tolerated the procedure well. The quality                            of the bowel preparation was adequate. The                            ileocecal valve, appendiceal orifice, and rectum                            were photographed. Scope In: 8:53:20 AM Scope Out: 9:15:14 AM Scope Withdrawal Time: 0 hours 17 minutes 5 seconds  Total Procedure Duration: 0 hours 21 minutes 54 seconds  Findings:                 The perianal and digital rectal examinations were                            normal.                           A diminutive polyp was found in the ascending                            colon. The polyp was sessile. The polyp was removed                            with a cold snare. Resection and retrieval were  complete.                           Scattered small-mouthed diverticula were found in                            the entire colon.                           There colon was spastic which prolonged the exam.                           Internal hemorrhoids were found during retroflexion.                           The exam was otherwise without abnormality. Complications:            No immediate complications. Estimated blood loss:                            Minimal. Estimated Blood Loss:     Estimated blood loss was minimal. Impression:               - One diminutive polyp in the ascending colon,                            removed with a cold snare. Resected and retrieved.                           - Diverticulosis in the entire examined colon.                           - Colonic spasm.                           - Internal hemorrhoids.                           - The examination was otherwise  normal. Recommendation:           - Patient has a contact number available for                            emergencies. The signs and symptoms of potential                            delayed complications were discussed with the                            patient. Return to normal activities tomorrow.                            Written discharge instructions were provided to the                            patient.                           -  Resume previous diet.                           - Continue present medications.                           - Await pathology results. Remo Lipps P. Navy Belay, MD 07/27/2021 9:19:27 AM This report has been signed electronically.

## 2021-07-27 NOTE — Progress Notes (Signed)
Called to room to assist during endoscopic procedure.  Patient ID and intended procedure confirmed with present staff. Received instructions for my participation in the procedure from the performing physician.  

## 2021-07-27 NOTE — Progress Notes (Signed)
[facility] History and Physical   Primary Care Physician:  Elby Showers, MD   Reason for Procedure:   History of colon polyps  Plan:    colonoscopy     HPI: [name] [name] is a 71 y.o. male  here for colonoscopy surveillance - at least 4 adenomas removed 04/2015. Patient denies any bowel symptoms at this time. No family history of colon cancer known. Otherwise feels well without any cardiopulmonary symptoms.    Past Medical History:  Diagnosis Date   BPH (benign prostatic hypertrophy)    Depression    Diabetes mellitus without complication ([place])    ED (erectile dysfunction)    Hyperlipidemia    Hypertension     Past Surgical History:  Procedure Laterality Date   BACK SURGERY     COLONOSCOPY  2016   Dr.Armbruster   NECK SURGERY      Prior to Admission medications   Medication Sig Start Date End Date Taking? Authorizing Provider  allopurinol (ZYLOPRIM) 300 MG tablet TAKE ONE TABLET BY MOUTH ONE TIME DAILY 11/06/20  Yes Baxley, Cresenciano Lick, MD  amLODipine (NORVASC) 5 MG tablet TAKE ONE TABLET BY MOUTH ONE TIME DAILY 02/02/21  Yes Baxley, Cresenciano Lick, MD  B-D UF III MINI PEN NEEDLES 31G X 5 MM MISC USE AS DIRECTED 08/19/16  Yes Baxley, Cresenciano Lick, MD  Blood Glucose Monitoring Suppl (ONETOUCH VERIO FLEX SYSTEM) w/Device KIT USE TO TEST BLOOD SUGAR 07/13/19  Yes Elby Showers, MD  buPROPion (WELLBUTRIN XL) 300 MG 24 hr tablet TAKE ONE TABLET BY MOUTH ONE TIME DAILY 01/07/21  Yes Elby Showers, MD  fenofibrate 160 MG tablet TAKE ONE TABLET BY MOUTH ONE TIME DAILY 10/31/20  Yes Elby Showers, MD  hydrochlorothiazide (HYDRODIURIL) 25 MG tablet TAKE ONE TABLET BY MOUTH ONE TIME DAILY 07/08/21  Yes Baxley, Cresenciano Lick, MD  JANUVIA 100 MG tablet TAKE ONE TABLET BY MOUTH ONE TIME DAILY 10/31/20  Yes Baxley, Cresenciano Lick, MD  Lancets (ONETOUCH DELICA PLUS [id number]) [place] TEST BLOOD SUGAR DAILY 05/26/20  Yes Elby Showers, MD  LANTUS SOLOSTAR 100 UNIT/ML Solostar Pen INJECT 22 UNITS UNDER THE SKIN  DAILY 07/08/21  Yes Elby Showers, MD  metFORMIN (GLUCOPHAGE) 1000 MG tablet TAKE ONE TABLET BY MOUTH TWICE A DAY WITH MEALS 04/07/21  Yes Baxley, Cresenciano Lick, MD  Multiple Vitamin (MULTIVITAMIN) tablet Take 1 tablet by mouth daily.     Yes [provider]  olmesartan (BENICAR) 40 MG tablet TAKE ONE TABLET BY MOUTH ONE TIME DAILY 08/24/20  Yes Baxley, Cresenciano Lick, MD  [facility] VERIO test strip TEST BLOOD SUGAR DAILY 05/26/20  Yes Baxley, Cresenciano Lick, MD  rosuvastatin (CRESTOR) 5 MG tablet TAKE ONE TABLET BY MOUTH ONE TIME DAILY 03/20/21  Yes Hilty, Nadean Corwin, MD  tamsulosin (FLOMAX) 0.4 MG CAPS capsule TAKE ONE CAPSULE BY MOUTH ONE TIME DAILY 10/31/20  Yes Baxley, Cresenciano Lick, MD  clonazePAM (KLONOPIN) 1 MG tablet Take 1 tablet (1 mg total) by mouth 2 (two) times daily as needed for anxiety. 03/16/21   Elby Showers, MD  cyclobenzaprine (FLEXERIL) 10 MG tablet Take 1 tablet (10 mg total) by mouth at bedtime as needed for muscle spasms. 12/23/16   Elby Showers, MD  nitroGLYCERIN (NITROSTAT) 0.4 MG SL tablet Place 1 tablet (0.4 mg total) under the tongue every 5 (five) minutes as needed for chest pain. Patient not taking: Reported on 06/21/2021 04/10/20 07/09/20  Richardo Priest, MD  triamcinolone (  KENALOG) 0.1 % Apply 1 application topically 3 (three) times daily. 06/02/20   Elby Showers, MD    Current Outpatient Medications  Medication Sig Dispense Refill   allopurinol (ZYLOPRIM) 300 MG tablet TAKE ONE TABLET BY MOUTH ONE TIME DAILY 90 tablet 2   amLODipine (NORVASC) 5 MG tablet TAKE ONE TABLET BY MOUTH ONE TIME DAILY 90 tablet 3   B-D UF III MINI PEN NEEDLES 31G X 5 MM MISC USE AS DIRECTED 100 each prn   Blood Glucose Monitoring Suppl (ONETOUCH VERIO FLEX SYSTEM) w/Device KIT USE TO TEST BLOOD SUGAR 1 kit prn   buPROPion (WELLBUTRIN XL) 300 MG 24 hr tablet TAKE ONE TABLET BY MOUTH ONE TIME DAILY 90 tablet 2   fenofibrate 160 MG tablet TAKE ONE TABLET BY MOUTH ONE TIME DAILY 300 tablet 0   hydrochlorothiazide  (HYDRODIURIL) 25 MG tablet TAKE ONE TABLET BY MOUTH ONE TIME DAILY 90 tablet 2   JANUVIA 100 MG tablet TAKE ONE TABLET BY MOUTH ONE TIME DAILY 270 tablet 0   Lancets (ONETOUCH DELICA PLUS [id number]) MISC TEST BLOOD SUGAR DAILY 100 each PRN   LANTUS SOLOSTAR 100 UNIT/ML Solostar Pen INJECT 22 UNITS UNDER THE SKIN DAILY 15 mL PRN   metFORMIN (GLUCOPHAGE) 1000 MG tablet TAKE ONE TABLET BY MOUTH TWICE A DAY WITH MEALS 180 tablet 3   Multiple Vitamin (MULTIVITAMIN) tablet Take 1 tablet by mouth daily.       olmesartan (BENICAR) 40 MG tablet TAKE ONE TABLET BY MOUTH ONE TIME DAILY 90 tablet 3   ONETOUCH VERIO test strip TEST BLOOD SUGAR DAILY 100 strip PRN   rosuvastatin (CRESTOR) 5 MG tablet TAKE ONE TABLET BY MOUTH ONE TIME DAILY 90 tablet 3   tamsulosin (FLOMAX) 0.4 MG CAPS capsule TAKE ONE CAPSULE BY MOUTH ONE TIME DAILY 270 capsule 0   clonazePAM (KLONOPIN) 1 MG tablet Take 1 tablet (1 mg total) by mouth 2 (two) times daily as needed for anxiety. 90 tablet 1   cyclobenzaprine (FLEXERIL) 10 MG tablet Take 1 tablet (10 mg total) by mouth at bedtime as needed for muscle spasms. 30 tablet 11   nitroGLYCERIN (NITROSTAT) 0.4 MG SL tablet Place 1 tablet (0.4 mg total) under the tongue every 5 (five) minutes as needed for chest pain. (Patient not taking: Reported on 06/21/2021) 25 tablet 3   triamcinolone (KENALOG) 0.1 % Apply 1 application topically 3 (three) times daily. 30 g 1   Current Facility-Administered Medications  Medication Dose Route Frequency Provider Last Rate Last Admin   0.9 %  sodium chloride infusion  500 mL Intravenous Once Armbruster, Carlota Raspberry, MD        Allergies as of 07/27/2021   (No Known Allergies)    Family History  Problem Relation Age of Onset   Cancer Mother    Stomach cancer Mother    Cancer Father    Colon cancer Neg Hx    Esophageal cancer Neg Hx     Social History   Socioeconomic History   Marital status: Married    Spouse name: Not on file   Number of  children: Not on file   Years of education: Not on file   Highest education level: Not on file  Occupational History   Not on file  Tobacco Use   Smoking status: Former    Types: Cigarettes    Quit date: 12/27/1989    Years since quitting: 31.6   Smokeless tobacco: Never  Vaping Use   Vaping Use:  Never used  Substance and Sexual Activity   Alcohol use: Yes    Alcohol/week: 6.0 standard drinks    Types: 6 Cans of beer per week    Comment: beer 6 pack per week   Drug use: No   Sexual activity: Not on file  Other Topics Concern   Not on file  Social History Narrative   Not on file   Social Determinants of Health   Financial Resource Strain: Not on file  Food Insecurity: Not on file  Transportation Needs: Not on file  Physical Activity: Not on file  Stress: Not on file  Social Connections: Not on file  Intimate Partner Violence: Not on file    Review of Systems: All other review of systems negative except as mentioned in the HPI.  Physical Exam: Vital signs BP (!) 163/59    Pulse 78    Temp 98.4 F (36.9 C)    Ht 6' 1" (1.854 m)    Wt 239 lb (108.4 kg)    SpO2 99%    BMI 31.53 kg/m   General:   Alert,  Well-developed, pleasant and cooperative in NAD Lungs:  Clear throughout to auscultation.   Heart:  Regular rate and rhythm Abdomen:  Soft, nontender and nondistended.   Neuro/Psych:  Alert and cooperative. Normal mood and affect. A and O x 3  Jolly Mango, MD [facility]

## 2021-07-27 NOTE — Patient Instructions (Signed)
Handouts on polyps, hemorrhoids, diverticulosis, and high fiber diet given.  YOU HAD AN ENDOSCOPIC PROCEDURE TODAY AT [facility]:   Refer to the procedure report that was given to you for any specific questions about what was found during the examination.  If the procedure report does not answer your questions, please call your gastroenterologist to clarify.  If you requested that your care partner not be given the details of your procedure findings, then the procedure report has been included in a sealed envelope for you to review at your convenience later.  YOU SHOULD EXPECT: Some feelings of bloating in the abdomen. Passage of more gas than usual.  Walking can help get rid of the air that was put into your GI tract during the procedure and reduce the bloating. If you had a lower endoscopy (such as a colonoscopy or flexible sigmoidoscopy) you may notice spotting of blood in your stool or on the toilet paper. If you underwent a bowel prep for your procedure, you may not have a normal bowel movement for a few days.  Please Note:  You might notice some irritation and congestion in your nose or some drainage.  This is from the oxygen used during your procedure.  There is no need for concern and it should clear up in a day or so.  SYMPTOMS TO REPORT IMMEDIATELY:  Following lower endoscopy (colonoscopy or flexible sigmoidoscopy):  Excessive amounts of blood in the stool  Significant tenderness or worsening of abdominal pains  Swelling of the abdomen that is new, acute  Fever of 100F or higher   For urgent or emergent issues, a gastroenterologist can be reached at any hour by calling [phone]. Do not use MyChart messaging for urgent concerns.    DIET:  We do recommend a small meal at first, but then you may proceed to your regular diet.  Drink plenty of fluids but you should avoid alcoholic beverages for 24 hours.  ACTIVITY:  You should plan to take it easy for the rest of  today and you should NOT DRIVE or use heavy machinery until tomorrow (because of the sedation medicines used during the test).    FOLLOW UP: Our staff will call the number listed on your records 48-72 hours following your procedure to check on you and address any questions or concerns that you may have regarding the information given to you following your procedure. If we do not reach you, we will leave a message.  We will attempt to reach you two times.  During this call, we will ask if you have developed any symptoms of COVID 19. If you develop any symptoms (ie: fever, flu-like symptoms, shortness of breath, cough etc.) before then, please call [phone].  If you test positive for Covid 19 in the 2 weeks post procedure, please call and report this information to [place].    If any biopsies were taken you will be contacted by phone or by letter within the next 1-3 weeks.  Please call us at [phone] if you have not heard about the biopsies in 3 weeks.    SIGNATURES/CONFIDENTIALITY: You and/or your care partner have signed paperwork which will be entered into your electronic medical record.  These signatures attest to the fact that that the information above on your After Visit Summary has been reviewed and is understood.  Full responsibility of the confidentiality of this discharge information lies with you and/or your care-partner.

## 2021-07-27 NOTE — Progress Notes (Signed)
Sedate, gd SR, tolerated procedure well, VSS, report to RN 

## 2021-07-28 ENCOUNTER — Other Ambulatory Visit: Payer: Self-pay | Admitting: Internal Medicine

## 2021-07-31 ENCOUNTER — Telehealth: Payer: Self-pay | Admitting: *Deleted

## 2021-07-31 NOTE — Telephone Encounter (Signed)
°  Follow up Call-  Call back number 07/27/2021  Post procedure Call Back phone  # [phone]  Permission to leave phone message Yes  Some recent data might be hidden     Patient questions:  Do you have a fever, pain , or abdominal swelling? No. Pain Score  0 *  Have you tolerated food without any problems? Yes.    Have you been able to return to your normal activities? Yes.    Do you have any questions about your discharge instructions: Diet   No. Medications  No. Follow up visit  No.  Do you have questions or concerns about your Care? No.  Actions: * If pain score is 4 or above: No action needed, pain <4.

## 2021-07-31 NOTE — Telephone Encounter (Signed)
°  Follow up Call-  Call back number 07/27/2021  Post procedure Call Back phone  # [phone]  Permission to leave phone message Yes  Some recent data might be hidden    First attempt for follow up phone call. No answer at number given.  Left message on voicemail.

## 2021-08-07 ENCOUNTER — Other Ambulatory Visit: Payer: PPO | Admitting: Internal Medicine

## 2021-08-07 ENCOUNTER — Other Ambulatory Visit: Payer: Self-pay

## 2021-08-07 DIAGNOSIS — E119 Type 2 diabetes mellitus without complications: Secondary | ICD-10-CM

## 2021-08-07 DIAGNOSIS — E785 Hyperlipidemia, unspecified: Secondary | ICD-10-CM

## 2021-08-07 DIAGNOSIS — E1169 Type 2 diabetes mellitus with other specified complication: Secondary | ICD-10-CM

## 2021-08-08 LAB — HEPATIC FUNCTION PANEL
AG Ratio: 2.4 (calc) (ref 1.0–2.5)
ALT: 19 U/L (ref 9–46)
AST: 20 U/L (ref 10–35)
Albumin: 4.5 g/dL (ref 3.6–5.1)
Alkaline phosphatase (APISO): 23 U/L — ABNORMAL LOW (ref 35–144)
Bilirubin, Direct: 0.2 mg/dL (ref 0.0–0.2)
Globulin: 1.9 g/dL (calc) (ref 1.9–3.7)
Indirect Bilirubin: 0.4 mg/dL (calc) (ref 0.2–1.2)
Total Bilirubin: 0.6 mg/dL (ref 0.2–1.2)
Total Protein: 6.4 g/dL (ref 6.1–8.1)

## 2021-08-08 LAB — HEMOGLOBIN A1C
Hgb A1c MFr Bld: 5.8 % of total Hgb — ABNORMAL HIGH (ref <5.7)
Mean Plasma Glucose: 120 mg/dL
eAG (mmol/L): 6.6 mmol/L

## 2021-08-08 LAB — LIPID PANEL
Cholesterol: 127 mg/dL (ref <200)
HDL: 42 mg/dL (ref >=40)
LDL Cholesterol (Calc): 71 mg/dL (calc)
Non-HDL Cholesterol (Calc): 85 mg/dL (calc) (ref <130)
Total CHOL/HDL Ratio: 3 (calc) (ref <5.0)
Triglycerides: 62 mg/dL (ref <150)

## 2021-08-10 ENCOUNTER — Encounter: Payer: Self-pay | Admitting: Internal Medicine

## 2021-08-10 ENCOUNTER — Ambulatory Visit (INDEPENDENT_AMBULATORY_CARE_PROVIDER_SITE_OTHER): Payer: PPO | Admitting: Internal Medicine

## 2021-08-10 ENCOUNTER — Other Ambulatory Visit: Payer: Self-pay

## 2021-08-10 VITALS — BP 130/68 | HR 71 | Temp 97.8°F | Ht 73.0 in | Wt 247.0 lb

## 2021-08-10 DIAGNOSIS — E1169 Type 2 diabetes mellitus with other specified complication: Secondary | ICD-10-CM

## 2021-08-10 DIAGNOSIS — E785 Hyperlipidemia, unspecified: Secondary | ICD-10-CM | POA: Diagnosis not present

## 2021-08-10 DIAGNOSIS — Z23 Encounter for immunization: Secondary | ICD-10-CM

## 2021-08-10 DIAGNOSIS — E119 Type 2 diabetes mellitus without complications: Secondary | ICD-10-CM

## 2021-08-10 MED ORDER — CLONAZEPAM 1 MG PO TABS: 1.0000 mg | ORAL_TABLET | Freq: Two times a day (BID) | ORAL | 2 refills | Status: DC | PRN

## 2021-08-10 NOTE — Progress Notes (Signed)
° °  Subjective:    Patient ID: [name] [name], male    DOB: 09-02-1950, 71 y.o.   MRN: [id number]  HPI 71 year old Male seen for 6 month follow up on HTN and Type 2  Diabetes mellitus. Has anxiety sometimes and Klonopin refilled at his request. Had flu-like illness recently about a month ago and felt fatigued for nearly a month. Says Home Covid test was negative.He has had 3 Covid vaccines by our records in 2021 but no booster so it is my feeling he may have had Covid-19 as there is a new variant circulating.  He requests flu vaccine today and this was given.  Hemoglobin A1c is 5.8% and was 6.1% in July.  Remains on Januvia 100 mg daily and Lantus 22 units daily as well as metformin 1000 mg twice daily with meals.  He is also on fenofibrate 160 mg daily and rosuvastatin 5 mg daily.  Lipid panel is normal.  Liver functions are within normal limits except for low alkaline phosphatase which is longstanding.  Hypertension well controlled on HCTZ and amlodipine and olmesartan.  Anxiety and depression treated with Wellbutrin, and Klonopin.  Klonopin refilled at his request.  For BPH he is on Flomax.  History of gout treated with allopurinol.  Review of Systems no new complaints     Objective:   Physical Exam Blood pressure 130/68 pulse 71 temperature 97.8 degrees pulse oximetry 98% weight 247 pounds BMI 32.59 Neck is supple without JVD thyromegaly or carotid bruits.  TMs chronically scarred.   Chest clear to auscultation without rales or wheezing.  Cardiac exam: Regular rate and rhythm without ectopy or murmur.      Assessment & Plan:  Diabetes mellitus-under excellent control on current regimen  Essential hypertension-excellent control on the current regimen of amlodipine HCTZ olmesartan (Benicar)  BMI 32.59-previously was 32.98 in July.  Continue diet and exercise efforts.  History of gout treated with allopurinol  BPH treated with Flomax  Hyperlipidemia under excellent control  with fenofibrate and Crestor  Plan: Flu vaccine given.  Recommend COVID booster in a couple of months.  Return in 6 months or as needed.  He will be due for Medicare wellness and health maintenance exam at that time.

## 2021-08-10 NOTE — Patient Instructions (Addendum)
Klonopin refilled.  Labs are all within normal labs. Flu vaccine given.  Labs are all within normal limits.  It was a pleasure to see you today.  Return in 6 months for Medicare wellness and health maintenance exam.  Consider COVID booster.

## 2021-09-28 ENCOUNTER — Encounter: Payer: Self-pay | Admitting: Internal Medicine

## 2021-09-28 MED ORDER — TRIAMCINOLONE ACETONIDE 0.1 % EX CREA: 1.0000 "application " | TOPICAL_CREAM | Freq: Three times a day (TID) | CUTANEOUS | 1 refills | Status: DC

## 2021-10-04 ENCOUNTER — Other Ambulatory Visit: Payer: Self-pay | Admitting: Internal Medicine

## 2021-10-07 ENCOUNTER — Other Ambulatory Visit: Payer: Self-pay | Admitting: Internal Medicine

## 2021-10-26 ENCOUNTER — Other Ambulatory Visit: Payer: Self-pay | Admitting: Internal Medicine

## 2021-10-31 ENCOUNTER — Other Ambulatory Visit: Payer: Self-pay | Admitting: Internal Medicine

## 2021-11-06 ENCOUNTER — Other Ambulatory Visit: Payer: Self-pay | Admitting: Internal Medicine

## 2022-01-31 ENCOUNTER — Other Ambulatory Visit: Payer: PPO

## 2022-01-31 DIAGNOSIS — E119 Type 2 diabetes mellitus without complications: Secondary | ICD-10-CM

## 2022-01-31 DIAGNOSIS — I10 Essential (primary) hypertension: Secondary | ICD-10-CM

## 2022-01-31 DIAGNOSIS — E1169 Type 2 diabetes mellitus with other specified complication: Secondary | ICD-10-CM

## 2022-01-31 DIAGNOSIS — Z125 Encounter for screening for malignant neoplasm of prostate: Secondary | ICD-10-CM

## 2022-02-01 ENCOUNTER — Other Ambulatory Visit: Payer: PPO

## 2022-02-01 DIAGNOSIS — Z125 Encounter for screening for malignant neoplasm of prostate: Secondary | ICD-10-CM

## 2022-02-01 DIAGNOSIS — E1169 Type 2 diabetes mellitus with other specified complication: Secondary | ICD-10-CM | POA: Diagnosis not present

## 2022-02-01 DIAGNOSIS — I10 Essential (primary) hypertension: Secondary | ICD-10-CM

## 2022-02-01 DIAGNOSIS — E119 Type 2 diabetes mellitus without complications: Secondary | ICD-10-CM

## 2022-02-01 DIAGNOSIS — E785 Hyperlipidemia, unspecified: Secondary | ICD-10-CM | POA: Diagnosis not present

## 2022-02-02 LAB — CBC WITH DIFFERENTIAL/PLATELET
Absolute Monocytes: 529 cells/uL (ref 200–950)
Basophils Absolute: 70 cells/uL (ref 0–200)
Basophils Relative: 1.3 %
Eosinophils Absolute: 130 cells/uL (ref 15–500)
Eosinophils Relative: 2.4 %
HCT: 40.3 % (ref 38.5–50.0)
Hemoglobin: 13.6 g/dL (ref 13.2–17.1)
Lymphs Abs: 1939 cells/uL (ref 850–3900)
MCH: 30.8 pg (ref 27.0–33.0)
MCHC: 33.7 g/dL (ref 32.0–36.0)
MCV: 91.4 fL (ref 80.0–100.0)
MPV: 11.2 fL (ref 7.5–12.5)
Monocytes Relative: 9.8 %
Neutro Abs: 2732 cells/uL (ref 1500–7800)
Neutrophils Relative %: 50.6 %
Platelets: 212 10*3/uL (ref 140–400)
RBC: 4.41 10*6/uL (ref 4.20–5.80)
RDW: 13.7 % (ref 11.0–15.0)
Total Lymphocyte: 35.9 %
WBC: 5.4 10*3/uL (ref 3.8–10.8)

## 2022-02-02 LAB — COMPLETE METABOLIC PANEL WITH GFR
AG Ratio: 2.3 (calc) (ref 1.0–2.5)
ALT: 18 U/L (ref 9–46)
AST: 21 U/L (ref 10–35)
Albumin: 4.5 g/dL (ref 3.6–5.1)
Alkaline phosphatase (APISO): 20 U/L — ABNORMAL LOW (ref 35–144)
BUN: 13 mg/dL (ref 7–25)
CO2: 29 mmol/L (ref 20–32)
Calcium: 9.9 mg/dL (ref 8.6–10.3)
Chloride: 98 mmol/L (ref 98–110)
Creat: 0.96 mg/dL (ref 0.70–1.28)
Globulin: 2 g/dL (calc) (ref 1.9–3.7)
Glucose, Bld: 99 mg/dL (ref 65–99)
Potassium: 4.8 mmol/L (ref 3.5–5.3)
Sodium: 135 mmol/L (ref 135–146)
Total Bilirubin: 0.6 mg/dL (ref 0.2–1.2)
Total Protein: 6.5 g/dL (ref 6.1–8.1)
eGFR: 85 mL/min/{1.73_m2} (ref >=60)

## 2022-02-02 LAB — LIPID PANEL
Cholesterol: 136 mg/dL
HDL: 44 mg/dL
LDL Cholesterol (Calc): 76 mg/dL
Non-HDL Cholesterol (Calc): 92 mg/dL
Total CHOL/HDL Ratio: 3.1 (calc)
Triglycerides: 81 mg/dL

## 2022-02-02 LAB — HEMOGLOBIN A1C
Hgb A1c MFr Bld: 6 % of total Hgb — ABNORMAL HIGH (ref <5.7)
Mean Plasma Glucose: 126 mg/dL
eAG (mmol/L): 7 mmol/L

## 2022-02-02 LAB — MICROALBUMIN, URINE: Microalb, Ur: 0.2 mg/dL

## 2022-02-02 LAB — PSA: PSA: 2.53 ng/mL (ref <=4.00)

## 2022-02-07 ENCOUNTER — Ambulatory Visit (INDEPENDENT_AMBULATORY_CARE_PROVIDER_SITE_OTHER): Payer: PPO | Admitting: Internal Medicine

## 2022-02-07 ENCOUNTER — Encounter: Payer: Self-pay | Admitting: Internal Medicine

## 2022-02-07 VITALS — BP 148/82 | HR 87 | Temp 97.8°F | Ht 71.5 in | Wt 248.5 lb

## 2022-02-07 DIAGNOSIS — R351 Nocturia: Secondary | ICD-10-CM

## 2022-02-07 DIAGNOSIS — E8881 Metabolic syndrome: Secondary | ICD-10-CM

## 2022-02-07 DIAGNOSIS — I10 Essential (primary) hypertension: Secondary | ICD-10-CM

## 2022-02-07 DIAGNOSIS — Z1211 Encounter for screening for malignant neoplasm of colon: Secondary | ICD-10-CM | POA: Diagnosis not present

## 2022-02-07 DIAGNOSIS — N401 Enlarged prostate with lower urinary tract symptoms: Secondary | ICD-10-CM

## 2022-02-07 DIAGNOSIS — Z8659 Personal history of other mental and behavioral disorders: Secondary | ICD-10-CM

## 2022-02-07 DIAGNOSIS — E785 Hyperlipidemia, unspecified: Secondary | ICD-10-CM | POA: Diagnosis not present

## 2022-02-07 DIAGNOSIS — E1169 Type 2 diabetes mellitus with other specified complication: Secondary | ICD-10-CM

## 2022-02-07 DIAGNOSIS — Z86010 Personal history of colonic polyps: Secondary | ICD-10-CM | POA: Diagnosis not present

## 2022-02-07 DIAGNOSIS — Z0000 Encounter for general adult medical examination without abnormal findings: Secondary | ICD-10-CM | POA: Diagnosis not present

## 2022-02-07 LAB — POCT URINALYSIS DIPSTICK
Bilirubin, UA: NEGATIVE
Blood, UA: NEGATIVE
Glucose, UA: NEGATIVE
Ketones, UA: NEGATIVE
Leukocytes, UA: NEGATIVE
Nitrite, UA: NEGATIVE
Protein, UA: NEGATIVE
Spec Grav, UA: 1.01 (ref 1.010–1.025)
Urobilinogen, UA: 0.2 E.U./dL
pH, UA: 7 (ref 5.0–8.0)

## 2022-02-07 LAB — HEMOCCULT GUIAC POC 1CARD (OFFICE)
Card #1 Date: 92024
Fecal Occult Blood, POC: NEGATIVE

## 2022-02-07 NOTE — Patient Instructions (Signed)
To call with BP readings

## 2022-02-07 NOTE — Progress Notes (Signed)
Annual Wellness Visit     Patient: [name] [name], Male    DOB: July 12, 1951, 71 y.o.   MRN: [id number] Visit Date: 02/07/2022  Chief Complaint  Patient presents with   Medicare Wellness   Subjective    [name] [name] is a 71 y.o. male who presents today for his Annual Wellness Visit.  HPI 71 year old Male seen for Medicare wellness, health maintenance exam and evaluation of medical issues.  He has a history of diabetes mellitus and hypertension.  He has history of anxiety and depression.  He has a history of gout.  History of hyperlipidemia.  He is compliant with his medications.  He exercises by playing golf and also with his work as a Hydrologist.  He has a history of obesity and metabolic syndrome.  History of back pain status post L2-L3 discectomy March 2014 with right lower extremity radiculopathy.  Colonoscopy done in 2010 with history of adenomatous colon polyps.  Had repeat study in 2016 and several polyps were removed at that time including 4 tubular adenomas and 2 hyperplastic colon polyps.  He had repeat colonoscopy in January 2023 and 1 polyp was removed which was adenomatous.  Repeat study recommended in 7 years i.e. 2030.  Reminded about annual diabetic eye exam.  History of degenerative arthritis AC joint of the right shoulder 2007 seen by Dr. Daylene Katayama.  In March 1999 he had a partial tear of gastrocnemius muscle on the left leg treated with Dr. Chancy Hurter.  He had a cyst removed from his spine in 1977 and it actually may have been a pilonidal cyst.  History of lumbar disc disease L4-L5 and L5-S1 in 1998.  He presented with lumbar radiculopathy at the time.  He received epidural steroids x3 with improvement.  Social history: He is married.  First wife died of cancer.  This is his second marriage.  Completed 1 year of college.  Enjoys golf.  Does not smoke.  Drinks beer.  2 adult children, a son and a daughter.  He is a self-employed Chief Executive Officer.  Family history: Mother died of stomach cancer at age 92.  Father died of lung cancer at age 32.  His parents died within 36 months of each other when he was 76 or 61 years old.  3 sisters in good health.      Review of Systems no new complaints-generally feels pretty well.   Objective    Vitals: BP (!) 148/82   Pulse 87   Temp 97.8 F (36.6 C) (Tympanic)   Ht 5' 11.5" (1.816 m)   Wt 248 lb 8 oz (112.7 kg)   SpO2 97%   BMI 34.18 kg/m   Physical Exam  Skin: Warm and dry.  No cervical adenopathy.  TMs clear.  Neck is supple.  No carotid bruits.  Chest clear to auscultation.  Cardiac exam: 1 regular rate and rhythm without ectopy.  Abdomen is soft, nondistended without hepatosplenomegaly masses or tenderness.  Prostate is normal without nodules.  Extremities without pitting edema.  Dorsalis pedis pulses are intact.  Sensation is intact in his feet.  Neurological exam is intact without gross focal deficits.  Affect thought and judgment are normal.   Most recent functional status assessment:    02/07/2022    2:52 PM  In your present state of health, do you have any difficulty performing the following activities:  Hearing? 0  Vision? 0  Difficulty concentrating or making decisions? 0  Walking or  climbing stairs? 0  Dressing or bathing? 0  Doing errands, shopping? 0  Preparing Food and eating ? N  Using the Toilet? N  In the past six months, have you accidently leaked urine? N  Do you have problems with loss of bowel control? N  Managing your Medications? N  Managing your Finances? N  Housekeeping or managing your Housekeeping? N   Most recent fall risk assessment:    02/07/2022    2:51 PM  Fall Risk   Falls in the past year? 0  Number falls in past yr: 0  Injury with Fall? 0  Risk for fall due to : No Fall Risks  Follow up Falls evaluation completed    Most recent depression screenings:    02/07/2022    2:52 PM 02/02/2021   11:13 AM  PHQ 2/9 Scores  PHQ  - 2 Score 0 0   Most recent cognitive screening:    02/07/2022    2:52 PM  6CIT Screen  What time? 0 points  Count back from 20 0 points  Months in reverse 0 points  Repeat phrase 0 points       Assessment & Plan   Type 2 diabetes mellitus.  Hemoglobin A1c is excellent at 6%.  He is currently on Januvia and metformin.  He also uses Lantus insulin 22 units under the skin daily  BPH treated with Flomax and stable  Essential hypertension-stable with olmesartan HCT and amlodipine.  Initially blood pressure was elevated at 148/82 but rechecked and was 130/82  Anxiety depression treated with Klonopin and Wellbutrin and stable  Mixed hyperlipidemia treated with Crestor 5 mg daily.  Lipid panel is completely normal.  He also takes fenofibrate 160 mg daily.  BMI 34.18 (248 pounds 8 ounces)  History of coronary disease and had coronary calcium scoring in 2021 by Dr. Oval Linsey and his total score was 2708.  He is followed by Dr. Bettina Gavia, cardiologist in [facility] with [facility].  He was placed on rosuvastatin 5 mg daily in September 2022 by Dr. Debara Pickett.  He has nitroglycerin on hand  He had a CT scan for coronary assessment and there was some evidence of a lesion of the left adrenal gland but further testing revealed this to be a cyst of the superior pole of the left kidney.  He also had a nonobstructive calculus of the superior pole of the right kidney.  Health maintenance: Vaccines discussed.  His tetanus immunization is due in 2024.  He has had Prevnar 13 and pneumococcal 23.  Mention pneumococcal 20 vaccine to him.  Last COVID-vaccine on file was 2021.  He has had a total of 3 of these.  He gets annual flu vaccine.  Has not had Shingrix vaccine or RSV vaccine.  Plan: He has follow-up visit here February 2024.  He will continue with medications described above.  He will try to watch his diet and stay physically active.       Annual wellness visit done today including the all of the  following: Reviewed patient's Family Medical History Reviewed and updated list of patient's medical providers Assessment of cognitive impairment was done Assessed patient's functional ability Established a written schedule for health screening [place] Completed and Reviewed  Discussed health benefits of physical activity, and encouraged him to engage in regular exercise appropriate for his age and condition.       IElby Showers, MD, have reviewed all documentation for this visit. The documentation  on 03/12/22 for the exam, diagnosis, procedures, and orders are all accurate and complete.   Angus Seller, CMA

## 2022-02-13 DIAGNOSIS — E119 Type 2 diabetes mellitus without complications: Secondary | ICD-10-CM | POA: Diagnosis not present

## 2022-02-13 LAB — HM DIABETES EYE EXAM

## 2022-02-14 ENCOUNTER — Encounter: Payer: Self-pay | Admitting: Internal Medicine

## 2022-02-14 ENCOUNTER — Telehealth: Payer: Self-pay | Admitting: Internal Medicine

## 2022-02-14 DIAGNOSIS — M25561 Pain in right knee: Secondary | ICD-10-CM | POA: Diagnosis not present

## 2022-02-14 MED ORDER — CLONAZEPAM 1 MG PO TABS: 1.0000 mg | ORAL_TABLET | Freq: Two times a day (BID) | ORAL | 0 refills | Status: DC | PRN

## 2022-02-14 NOTE — Telephone Encounter (Signed)
Have refilled Klonopin 1 mg up to twice daily as needed for anxiety. Patient is to take this sparing. Wellbutrin was recently refilled. MJB, MD

## 2022-03-06 ENCOUNTER — Encounter: Payer: Self-pay | Admitting: Internal Medicine

## 2022-03-15 ENCOUNTER — Other Ambulatory Visit: Payer: Self-pay | Admitting: Internal Medicine

## 2022-03-27 DIAGNOSIS — M25561 Pain in right knee: Secondary | ICD-10-CM | POA: Diagnosis not present

## 2022-04-02 ENCOUNTER — Other Ambulatory Visit: Payer: Self-pay | Admitting: Internal Medicine

## 2022-04-24 ENCOUNTER — Other Ambulatory Visit: Payer: Self-pay | Admitting: Internal Medicine

## 2022-05-05 ENCOUNTER — Other Ambulatory Visit: Payer: Self-pay | Admitting: Internal Medicine

## 2022-07-01 ENCOUNTER — Other Ambulatory Visit: Payer: Self-pay | Admitting: Internal Medicine

## 2022-07-09 IMAGING — CT CT HEART MORP W/ CTA COR W/ SCORE W/ CA W/CM &/OR W/O CM
4 of 7 series · 8 of 20 positions shown, 9 images · IV contrast (APPLIED)
Comparison: None.

Addendum:
CLINICAL DATA: 73M with diabetes, hypertension, hyperlipidemia and
chest pain.

EXAM:
Cardiac/Coronary  CT
TECHNIQUE: The patient was scanned on a Phillips Force scanner.

[Series 6: best diast 71 % · axial · 0.45mm/px · z∈[+1233,+1288]mm · 2 of 412 slices shown, 3 images]
[im 138/412  vessel]
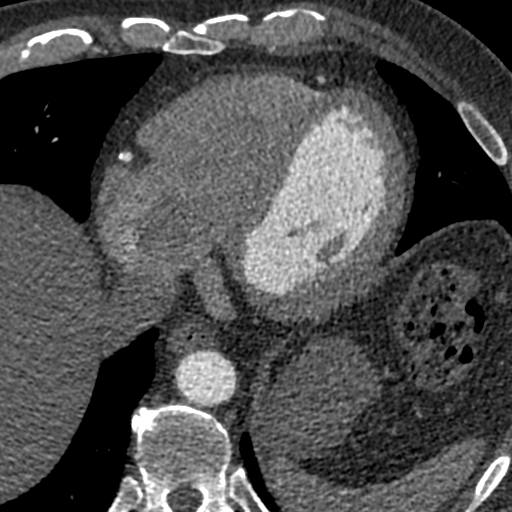
[im 138/412  lung]
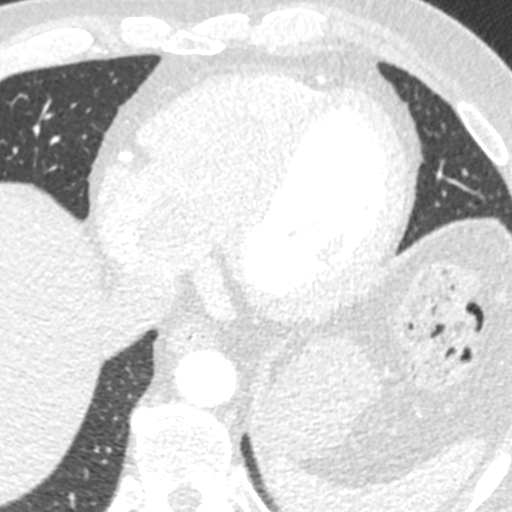
[im 275/412  vessel]
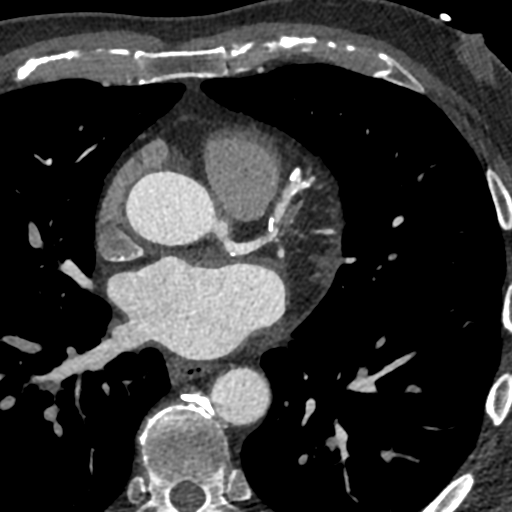

[Series 7: best syst 37 % · axial · 0.45mm/px · z∈[+1233,+1288]mm · 2 of 412 slices shown]
[im 138/412  vessel]
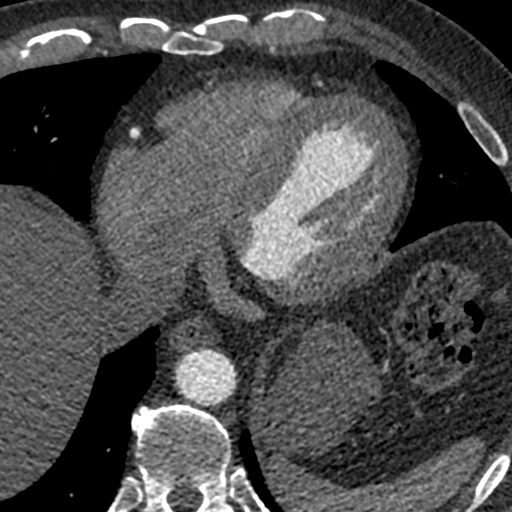
[im 275/412  vessel]
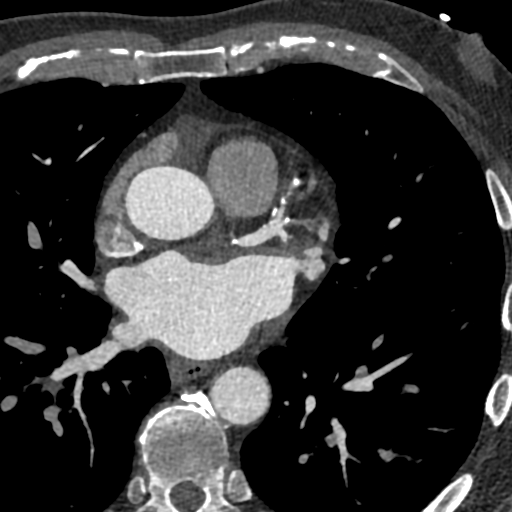

[Series 8: ts diast sharp 71 % · axial · 0.45mm/px · z∈[+1233,+1288]mm · 2 of 412 slices shown]
[im 138/412  lung]
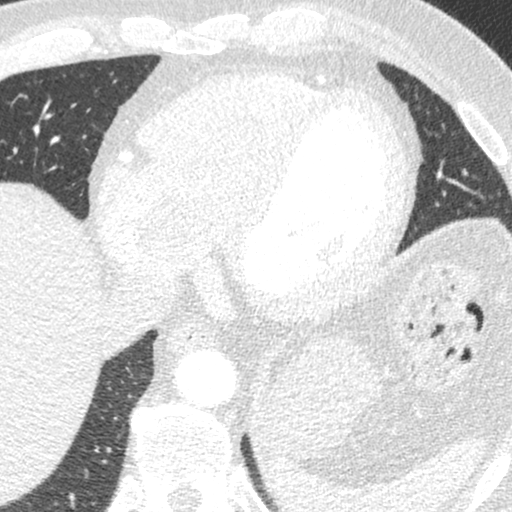
[im 275/412  lung]
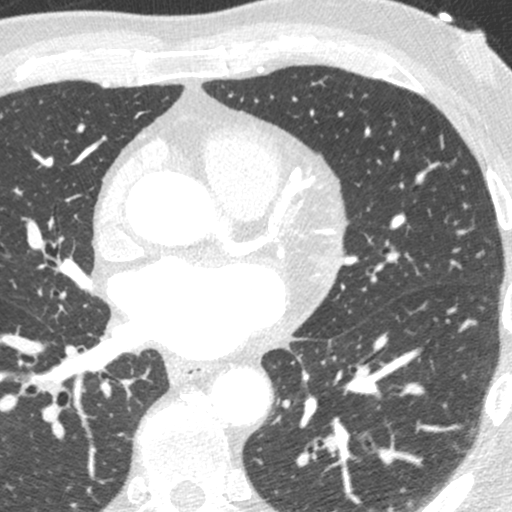

[Series 9: ts syst sharp 37 % · axial · 0.45mm/px · z∈[+1233,+1288]mm · 2 of 412 slices shown]
[im 138/412  lung]
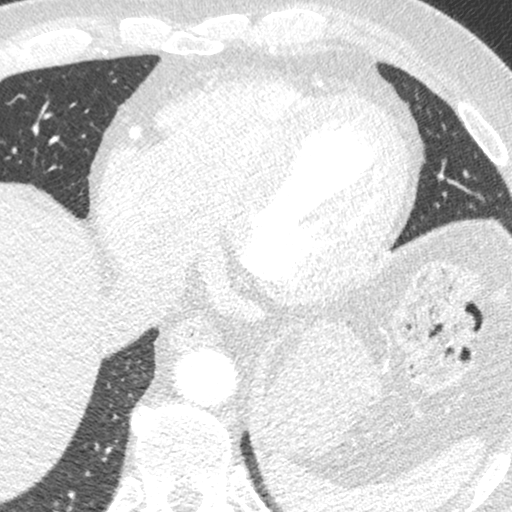
[im 275/412  lung]
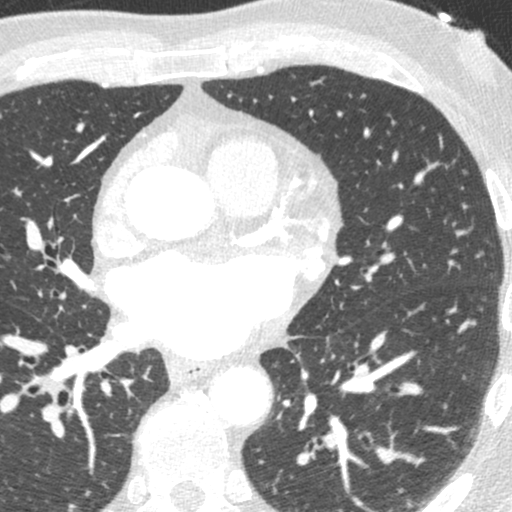

[8 of 20 positions shown; findings below may reference images not displayed]



Aorta: Normal size. Ascending aorta 3.5 cm. Mild calcification of
the aortic root and arch. No dissection.

Aortic Valve:  Trileaflet.  Mild calcification of the aortic root.

Coronary Arteries:  Normal coronary origin.  Right dominance.

RCA is a large dominant artery that gives rise to PDA and PLVB. The
RCA is heavily calcified. There is moderate (50-69%) plaque
proximally, mild (25-49%) mixed plaque in the mid RCA, and mild
mixed plaque distally. There is at least mild (25-49%) calcified
plaque in ostial PL-1.

Left main is a large artery that gives rise to LAD, RI, and LCX
arteries. There is minimal (<25%) calcified plaque.

LAD is a large vessel that has mild (25-49%) mixed plaque proximally
with a focal region of moderate (50-69%) mixed plaque at the level
of D1 and just distal to D1. Minimal calcified plaque distally.
There is a a large D1 with mild (25-49%) calcified plaque at the
ostium and proximally.

RI has mild (25-49%) calcified plaque at the ostium.

LCX is a non-dominant artery that gives rise to three small OM
branches. The proximal LCX is heavily calcified. Cannot exclude
severe (>70%) plaque. There is moderate (50-69%) mixed plaque in the
mid LCX.

Other findings:

Normal pulmonary vein drainage into the left atrium.

Normal let atrial appendage without a thrombus.

Normal size of the pulmonary artery.
IMPRESSION: 1. Coronary calcium score of 7887. This was 97th percentile for age
and sex matched control.

2. Normal coronary origin with right dominance.

3. The proximal LCX is heavily calcified. Cannot exclude severe
(>70%) plaque. Given that the region is heavily calcified there may
be blooming artifact which can overestimate plaque severity will
send study for FFRct.

4. There is heavy calcification in all three coronary distributions
with moderate obstruction in the RCA, minimal in the LM, moderate in
the LAD and possibly severe in the LCX. CAD-RADS 4.

5. Recommend aggressive risk factor modification including LDL goal
<70 and possibly cardiac catheterization pending FFRct results.

EXAM:
OVER-READ INTERPRETATION  CT CHEST

The following report is an over-read performed by radiologist Dr.
does not include interpretation of cardiac or coronary anatomy or
pathology. The coronary calcium score/coronary CTA interpretation by
the cardiologist is attached.
FINDINGS: The visualized portions of the lower lung fields show no suspicious
nodules, masses, or infiltrates. No pleural fluid seen.

The visualized portions of the mediastinum and chest wall are
unremarkable. In the upper abdomen, there is incomplete
visualization of a low-attenuation density in the left suprarenal
region which measures at least 1.9 cm. This is suspicious for benign
adrenal mass.
IMPRESSION: No significant non-cardiovascular abnormality seen in visualized
portion of the thorax.

Suspect left adrenal mass, which is incompletely visualized on this
exam. Adrenal protocol abdomen CT without and with contrast is
recommended for further evaluation.

These results will be called to the ordering clinician or
representative by the Radiologist Assistant, and communication
documented in the PACS or [REDACTED].

*** End of Addendum ***
FINDINGS: A 120 kV prospective scan was triggered in the descending thoracic
aorta at 111 HU's. Axial non-contrast 3 mm slices were carried out
through the heart. The data set was analyzed on a dedicated work
station and scored using the Agatson method. Gantry rotation speed
was 250 msecs and collimation was .6 mm. No beta blockade and 0.8 mg
of sl NTG was given. The 3D data set was reconstructed in 5%
intervals of the 67-82 % of the R-R cycle. Diastolic phases were
analyzed on a dedicated work station using MPR, MIP and VRT modes.
The patient received 80 cc of contrast.

Aorta: Normal size. Ascending aorta 3.5 cm. Mild calcification of
the aortic root and arch. No dissection.

Aortic Valve:  Trileaflet.  Mild calcification of the aortic root.

Coronary Arteries:  Normal coronary origin.  Right dominance.

RCA is a large dominant artery that gives rise to PDA and PLVB. The
RCA is heavily calcified. There is moderate (50-69%) plaque
proximally, mild (25-49%) mixed plaque in the mid RCA, and mild
mixed plaque distally. There is at least mild (25-49%) calcified
plaque in ostial PL-1.

Left main is a large artery that gives rise to LAD, RI, and LCX
arteries. There is minimal (<25%) calcified plaque.

LAD is a large vessel that has mild (25-49%) mixed plaque proximally
with a focal region of moderate (50-69%) mixed plaque at the level
of D1 and just distal to D1. Minimal calcified plaque distally.
There is a a large D1 with mild (25-49%) calcified plaque at the
ostium and proximally.

RI has mild (25-49%) calcified plaque at the ostium.

LCX is a non-dominant artery that gives rise to three small OM
branches. The proximal LCX is heavily calcified. Cannot exclude
severe (>70%) plaque. There is moderate (50-69%) mixed plaque in the
mid LCX.

Other findings:

Normal pulmonary vein drainage into the left atrium.

Normal let atrial appendage without a thrombus.

Normal size of the pulmonary artery.
IMPRESSION: 1. Coronary calcium score of 7887. This was 97th percentile for age
and sex matched control.

2. Normal coronary origin with right dominance.

3. The proximal LCX is heavily calcified. Cannot exclude severe
(>70%) plaque. Given that the region is heavily calcified there may
be blooming artifact which can overestimate plaque severity will
send study for FFRct.

4. There is heavy calcification in all three coronary distributions
with moderate obstruction in the RCA, minimal in the LM, moderate in
the LAD and possibly severe in the LCX. CAD-RADS 4.

5. Recommend aggressive risk factor modification including LDL goal
<70 and possibly cardiac catheterization pending FFRct results.

## 2022-07-27 ENCOUNTER — Other Ambulatory Visit: Payer: Self-pay | Admitting: Internal Medicine

## 2022-07-27 MED ORDER — CLONAZEPAM 1 MG PO TABS: 1.0000 mg | ORAL_TABLET | Freq: Two times a day (BID) | ORAL | 5 refills | Status: DC | PRN

## 2022-07-30 ENCOUNTER — Ambulatory Visit (INDEPENDENT_AMBULATORY_CARE_PROVIDER_SITE_OTHER): Payer: PPO

## 2022-07-30 VITALS — BP 134/70 | Temp 98.1°F

## 2022-07-30 DIAGNOSIS — Z23 Encounter for immunization: Secondary | ICD-10-CM | POA: Diagnosis not present

## 2022-07-31 ENCOUNTER — Ambulatory Visit: Payer: PPO

## 2022-08-03 ENCOUNTER — Other Ambulatory Visit: Payer: Self-pay | Admitting: Internal Medicine

## 2022-08-11 ENCOUNTER — Other Ambulatory Visit: Payer: Self-pay | Admitting: Internal Medicine

## 2022-08-11 MED ORDER — FENOFIBRATE 160 MG PO TABS: 160.0000 mg | ORAL_TABLET | Freq: Every day | ORAL | 0 refills | Status: DC

## 2022-08-11 MED ORDER — TAMSULOSIN HCL 0.4 MG PO CAPS: 0.4000 mg | ORAL_CAPSULE | Freq: Every day | ORAL | 0 refills | Status: DC

## 2022-08-12 MED ORDER — ROSUVASTATIN CALCIUM 5 MG PO TABS: 5.0000 mg | ORAL_TABLET | Freq: Every day | ORAL | 0 refills | Status: DC

## 2022-08-13 ENCOUNTER — Other Ambulatory Visit: Payer: PPO

## 2022-08-13 DIAGNOSIS — E1169 Type 2 diabetes mellitus with other specified complication: Secondary | ICD-10-CM | POA: Diagnosis not present

## 2022-08-13 DIAGNOSIS — E119 Type 2 diabetes mellitus without complications: Secondary | ICD-10-CM

## 2022-08-13 DIAGNOSIS — E785 Hyperlipidemia, unspecified: Secondary | ICD-10-CM | POA: Diagnosis not present

## 2022-08-14 LAB — MICROALBUMIN / CREATININE URINE RATIO
Creatinine, Urine: 88 mg/dL (ref 20–320)
Microalb Creat Ratio: 6 mcg/mg creat (ref <30)
Microalb, Ur: 0.5 mg/dL

## 2022-08-14 LAB — LIPID PANEL
Cholesterol: 140 mg/dL (ref <200)
HDL: 50 mg/dL (ref >=40)
LDL Cholesterol (Calc): 75 mg/dL (calc)
Non-HDL Cholesterol (Calc): 90 mg/dL (calc) (ref <130)
Total CHOL/HDL Ratio: 2.8 (calc) (ref <5.0)
Triglycerides: 69 mg/dL (ref <150)

## 2022-08-14 LAB — HEPATIC FUNCTION PANEL
AG Ratio: 1.8 (calc) (ref 1.0–2.5)
ALT: 20 U/L (ref 9–46)
AST: 21 U/L (ref 10–35)
Albumin: 4.3 g/dL (ref 3.6–5.1)
Alkaline phosphatase (APISO): 22 U/L — ABNORMAL LOW (ref 35–144)
Bilirubin, Direct: 0.2 mg/dL (ref 0.0–0.2)
Globulin: 2.4 g/dL (calc) (ref 1.9–3.7)
Indirect Bilirubin: 0.3 mg/dL (calc) (ref 0.2–1.2)
Total Bilirubin: 0.5 mg/dL (ref 0.2–1.2)
Total Protein: 6.7 g/dL (ref 6.1–8.1)

## 2022-08-14 LAB — HEMOGLOBIN A1C
Hgb A1c MFr Bld: 6.3 % of total Hgb — ABNORMAL HIGH (ref <5.7)
Mean Plasma Glucose: 134 mg/dL
eAG (mmol/L): 7.4 mmol/L

## 2022-08-15 ENCOUNTER — Ambulatory Visit (INDEPENDENT_AMBULATORY_CARE_PROVIDER_SITE_OTHER): Payer: PPO | Admitting: Internal Medicine

## 2022-08-15 ENCOUNTER — Other Ambulatory Visit: Payer: Self-pay

## 2022-08-15 VITALS — BP 144/76 | HR 88 | Temp 97.9°F | Ht 71.5 in | Wt 256.1 lb

## 2022-08-15 DIAGNOSIS — E785 Hyperlipidemia, unspecified: Secondary | ICD-10-CM

## 2022-08-15 DIAGNOSIS — N401 Enlarged prostate with lower urinary tract symptoms: Secondary | ICD-10-CM | POA: Diagnosis not present

## 2022-08-15 DIAGNOSIS — M5441 Lumbago with sciatica, right side: Secondary | ICD-10-CM

## 2022-08-15 DIAGNOSIS — E1169 Type 2 diabetes mellitus with other specified complication: Secondary | ICD-10-CM | POA: Diagnosis not present

## 2022-08-15 DIAGNOSIS — G8929 Other chronic pain: Secondary | ICD-10-CM

## 2022-08-15 DIAGNOSIS — R351 Nocturia: Secondary | ICD-10-CM

## 2022-08-15 DIAGNOSIS — Z8659 Personal history of other mental and behavioral disorders: Secondary | ICD-10-CM | POA: Diagnosis not present

## 2022-08-15 DIAGNOSIS — I10 Essential (primary) hypertension: Secondary | ICD-10-CM | POA: Diagnosis not present

## 2022-08-15 MED ORDER — ROSUVASTATIN CALCIUM 5 MG PO TABS: 5.0000 mg | ORAL_TABLET | Freq: Every day | ORAL | 3 refills | Status: DC

## 2022-08-15 NOTE — Patient Instructions (Addendum)
Vaccines were discussed at length.  He will get these at the pharmacy since he is on Medicare.  He has impacted cerumen in his right external ear canal and will go to urgent care to have this extracted.  His labs are stable.  He needs to lose a bit of weight and bring his A1c down some.  He has not been very active this winter.  He will return in 6 months for Medicare wellness and health maintenance exam.  He will continue current medications.  He is requesting prescription for new glucose monitor and that will be provided.

## 2022-08-15 NOTE — Progress Notes (Signed)
Patient Care Team: Elby Showers, MD as PCP - General (Internal Medicine)  Visit Date: 08/15/22  Subjective:    Patient ID: [name] [name] , Male   DOB: [birth year], 72 y.o.    MRN: [id number]   72 y.o. Male presents today for a 6 month follow-up. Patient has a past medical history of hyperlipidemia, hypertension, diabetes mellitus without complication, depression, erectile dysfunction, benign prostatic hypertrophy.  History of diabetes mellitus treated with Januvia 100 mg daily, Lantus solostart 22 units daily, Glucophage 1000 mg twice daily with a meal. Last HGBA1C at 6.3 on 08/13/22.  History of hyperlipidemia treated with Fenofibrate 160 mg daily, Crestor 5 mg daily. LDL normal at 75 on 08/13/22.  History of hypertension treated with Norvasc 5 mg daily, HCTZ 25 mg once daily, Benicar 40 mg daily.  He has history of anxiety and depression treated with Wellbutrin XL 300 mg daily, Klonopin 1 mg twice daily as needed.  History of gout treated with Zyloprim 300 mg daily.  History of back pain status post L2-L3 discectomy March 2014 with right lower extremity radiculopathy. He is using Kenalog 0.1% three times daily.  History of muscle spasms treated with Flexeril 10 mg at bedtime.   Colonoscopy done in 2010 with history of adenomatous colon polyps.  Had repeat study in 2016 and several polyps were removed at that time including 4 tubular adenomas and 2 hyperplastic colon polyps.  He had repeat colonoscopy in January 2023 and 1 polyp was removed which was adenomatous.  Repeat study recommended in 7 years i.e. 2030.  Reports that he has not been active for the past 2 months.  He had his eye exam on 02/28/22 at [facility].    Past Medical History:  Diagnosis Date   BPH (benign prostatic hypertrophy)    Depression    Diabetes mellitus without complication ([place])    ED (erectile dysfunction)    Hyperlipidemia    Hypertension      Family History  Problem Relation Age of Onset    Cancer Mother    Stomach cancer Mother    Cancer Father    Colon cancer Neg Hx    Esophageal cancer Neg Hx     Social History   Social History Narrative   Not on file      Review of Systems  Constitutional:  Negative for fever and malaise/fatigue.  HENT:  Negative for congestion.   Eyes:  Negative for blurred vision.  Respiratory:  Negative for cough and shortness of breath.   Cardiovascular:  Negative for chest pain, palpitations and leg swelling.  Gastrointestinal:  Negative for vomiting.  Musculoskeletal:  Negative for back pain.  Skin:  Negative for rash.  Neurological:  Negative for loss of consciousness and headaches.        Objective:   Vitals: BP (!) 144/76   Pulse 88   Temp 97.9 F (36.6 C) (Tympanic)   Ht 5' 11.5" (1.816 m)   Wt 256 lb 1.9 oz (116.2 kg)   SpO2 98%   BMI 35.22 kg/m    Physical Exam Constitutional:      General: He is not in acute distress.    Appearance: Normal appearance. He is not ill-appearing.  HENT:     Head: Normocephalic and atraumatic.     Right Ear: There is impacted cerumen.     Left Ear: Tympanic membrane, ear canal and external ear normal. There is no impacted cerumen.  Cardiovascular:  Pulses:          Dorsalis pedis pulses are 2+ on the right side and 2+ on the left side.       Posterior tibial pulses are 0 on the right side and 0 on the left side.     Heart sounds: Normal heart sounds. No murmur heard.    No friction rub. No gallop.  Pulmonary:     Effort: Pulmonary effort is normal. No respiratory distress.     Breath sounds: Normal breath sounds. No wheezing.  Feet:     Right foot:     Skin integrity: Warmth and callus present. No ulcer, blister, skin breakdown or erythema.     Left foot:     Skin integrity: Warmth and callus present. No ulcer, blister, skin breakdown or erythema.  Neurological:     Mental Status: He is alert and oriented to person, place, and time.  Psychiatric:        Mood and  Affect: Mood normal.        Behavior: Behavior normal.        Thought Content: Thought content normal.        Judgment: Judgment normal.       Results:   Studies obtained and personally reviewed by me:    Labs:       Component Value Date/Time   NA 135 02/01/2022 0957   NA 132 (L) 04/10/2020 1348   K 4.8 02/01/2022 0957   CL 98 02/01/2022 0957   CO2 29 02/01/2022 0957   GLUCOSE 99 02/01/2022 0957   BUN 13 02/01/2022 0957   BUN 13 04/10/2020 1348   CREATININE 0.96 02/01/2022 0957   CALCIUM 9.9 02/01/2022 0957   PROT 6.7 08/13/2022 0910   PROT 6.7 04/10/2020 1348   ALBUMIN 4.7 04/10/2020 1348   AST 21 08/13/2022 0910   ALT 20 08/13/2022 0910   ALKPHOS 24 (L) 04/10/2020 1348   BILITOT 0.5 08/13/2022 0910   BILITOT 0.4 04/10/2020 1348   GFRNONAA 79 04/10/2020 1348   GFRNONAA 85 01/25/2020 1218   GFRAA 91 04/10/2020 1348   GFRAA 99 01/25/2020 1218     Lab Results  Component Value Date   WBC 5.4 02/01/2022   HGB 13.6 02/01/2022   HCT 40.3 02/01/2022   MCV 91.4 02/01/2022   PLT 212 02/01/2022    Lab Results  Component Value Date   CHOL 140 08/13/2022   HDL 50 08/13/2022   LDLCALC 75 08/13/2022   TRIG 69 08/13/2022   CHOLHDL 2.8 08/13/2022    Lab Results  Component Value Date   HGBA1C 6.3 (H) 08/13/2022     No results found for: "TSH"   Lab Results  Component Value Date   PSA 2.53 02/01/2022   PSA 3.74 01/30/2021   PSA 2.7 01/25/2020      Assessment & Plan:   Diabetes Mellitus: Compliant with Januvia 100 mg daily, Lantus solostart 22 units daily, Glucophage 1000 mg twice daily with a meal. Last HGBA1C at 6.3 on 08/13/22. Could be eating healthier diet and exercising more. Ordered new blood glucose meter.  Hyperlipidemia: Well-controlled with Fenofibrate 160 mg daily, Crestor 5 mg daily. LDL normal at 75 on 08/13/22.  Hypertension: Well-controlled with Norvasc 5 mg daily, HCTZ 25 mg once daily, Benicar 40 mg daily.  Anxiety and Depression:  Well-controlled with Wellbutrin XL 300 mg daily, Klonopin 1 mg twice daily as needed.  Gout: Well-controlled with Zyloprim 300 mg daily.  Back pain: He is  using Kenalog 0.1% three times daily.  Muscle Spasms: Well-controlled with Flexeril 10 mg at bedtime.  Vaccine Counseling: He will get the flu, pneumococcal 20 and shingles vaccines at the drug store.  RTC in 6 months  I,Alexander Ruley,acting as a scribe for Elby Showers, MD.,have documented all relevant documentation on the behalf of Elby Showers, MD,as directed by  Elby Showers, MD while in the presence of Elby Showers, MD.   I, Elby Showers, MD, have reviewed all documentation for this visit. The documentation on 08/25/22 for the exam, diagnosis, procedures, and orders are all accurate and complete.

## 2022-08-20 ENCOUNTER — Ambulatory Visit
Admission: EM | Admit: 2022-08-20 | Discharge: 2022-08-20 | Disposition: A | Discharge status: Home / Self Care | Payer: PPO | Attending: Physician Assistant | Admitting: Physician Assistant

## 2022-08-20 DIAGNOSIS — H6121 Impacted cerumen, right ear: Secondary | ICD-10-CM | POA: Diagnosis not present

## 2022-08-20 MED ORDER — CARBAMIDE PEROXIDE 6.5 % OT SOLN: 5.0000 [drp] | Freq: Once | OTIC | Status: DC

## 2022-08-20 MED ORDER — NEOMYCIN-POLYMYXIN-HC 3.5-10000-1 OT SUSP: 4.0000 [drp] | Freq: Three times a day (TID) | OTIC | 0 refills | Status: DC

## 2022-08-20 NOTE — Discharge Instructions (Addendum)
Advised to observe the water sensation should resolve in the ear over the next couple days and return to normal.  Advised follow-up PCP or return to urgent care as needed.

## 2022-08-20 NOTE — ED Triage Notes (Signed)
Pt states has something in his rt ear for 2ks causing decrease hearing. States a wk his PCP tried to get it our but couldn't and states it was painful.

## 2022-08-20 NOTE — ED Provider Notes (Addendum)
[facility]    CSN: [id number] Arrival date & time: 08/20/22  1017      History   Chief Complaint Chief Complaint  Patient presents with   Ear Fullness    HPI [name] [name] is a 72 y.o. male.   72 year old male presents with right ear blocked with wax.  Patient indicates with past 2 weeks he noticed decreased hearing in the right ear.  He relates that a week ago he went to his PCP who indicated he had wax in the ear blocking the canal.  He indicates that the PCP tried to remove the wax manually but was unable to do so.  He indicates for the past week he is still having blockage right ear and is unable to hear.  He indicates it is not painful.  He is without fever or chills, and no cough or congestion.   Ear Fullness    Past Medical History:  Diagnosis Date   BPH (benign prostatic hypertrophy)    Depression    Diabetes mellitus without complication [facility])    ED (erectile dysfunction)    Hyperlipidemia    Hypertension     Patient Active Problem List   Diagnosis Date Noted   Hyperlipidemia 04/03/2020   Chest pain of uncertain etiology 54/00/8676   Insulin dependent diabetes mellitus 02/07/2019   Hypertriglyceridemia 02/07/2019   Cervical radiculopathy 19/50/9326   Metabolic syndrome 71/24/5809   Depression 07/04/2011   Musculoskeletal pain 12/28/2010   GOUT, UNSPECIFIED 09/30/2007   OBESITY, UNSPECIFIED 09/30/2007   Essential hypertension 09/30/2007   COLONIC POLYPS, ADENOMATOUS 12/07/2002    Past Surgical History:  Procedure Laterality Date   BACK SURGERY     COLONOSCOPY  2016   Dr.Armbruster   NECK SURGERY         Home Medications    Prior to Admission medications   Medication Sig Start Date End Date Taking? Authorizing Provider  neomycin-polymyxin-hydrocortisone (CORTISPORIN) 3.5-10000-1 OTIC suspension Place 4 drops into the right ear 3 (three) times daily. 08/20/22  Yes Nyoka Lint, PA-C  allopurinol (ZYLOPRIM) 300 MG tablet TAKE ONE  TABLET BY MOUTH ONE TIME DAILY 11/01/21   Elby Showers, MD  amLODipine (NORVASC) 5 MG tablet TAKE ONE TABLET BY MOUTH ONE TIME DAILY 04/24/22   Elby Showers, MD  B-D UF III MINI PEN NEEDLES 31G X 5 MM MISC USE AS DIRECTED 08/19/16   Elby Showers, MD  Blood Glucose Monitoring Suppl (ONETOUCH VERIO FLEX SYSTEM) w/Device KIT USE TO TEST BLOOD SUGAR 07/13/19   Elby Showers, MD  buPROPion (WELLBUTRIN XL) 300 MG 24 hr tablet TAKE ONE TABLET BY MOUTH ONE TIME DAILY 07/01/22   Elby Showers, MD  clonazePAM (KLONOPIN) 1 MG tablet Take 1 tablet (1 mg total) by mouth 2 (two) times daily as needed for anxiety. 07/27/22   Elby Showers, MD  cyclobenzaprine (FLEXERIL) 10 MG tablet Take 1 tablet (10 mg total) by mouth at bedtime as needed for muscle spasms. 12/23/16   Elby Showers, MD  fenofibrate 160 MG tablet Take 1 tablet (160 mg total) by mouth daily. 08/11/22   Elby Showers, MD  hydrochlorothiazide (HYDRODIURIL) 25 MG tablet TAKE ONE TABLET BY MOUTH ONE TIME DAILY 04/02/22   Elby Showers, MD  JANUVIA 100 MG tablet TAKE ONE TABLET BY MOUTH ONE TIME DAILY 04/24/22   Elby Showers, MD  Lancets [facility] DELICA PLUS XIPJAS50N) MISC USE TO TEST ONCE DAILY 08/04/22   Tedra Senegal  J, MD  LANTUS SOLOSTAR 100 UNIT/ML Solostar Pen INJECT 22 UNITS UNDER THE SKIN DAILY 07/08/21   Elby Showers, MD  metFORMIN (GLUCOPHAGE) 1000 MG tablet TAKE ONE TABLET BY MOUTH TWICE A DAY WITH A MEAL 04/02/22   Elby Showers, MD  Multiple Vitamin (MULTIVITAMIN) tablet Take 1 tablet by mouth daily.      [provider]  olmesartan (BENICAR) 40 MG tablet TAKE ONE TABLET BY MOUTH ONE TIME DAILY 05/05/22   Elby Showers, MD  [facility] VERIO test strip USE TO TEST BLOOD SUGAR ONCE DAILY 08/04/22   Elby Showers, MD  rosuvastatin (CRESTOR) 5 MG tablet Take 1 tablet (5 mg total) by mouth daily. PATIENT MUST SCHEDULE APPOINTMENT FOR FUTURE REFILLS LAST ATTEMPT 08/15/22   Elby Showers, MD  tamsulosin (FLOMAX) 0.4 MG CAPS  capsule Take 1 capsule (0.4 mg total) by mouth daily. 08/11/22   Elby Showers, MD  triamcinolone cream (KENALOG) 0.1 % Apply 1 application. topically 3 (three) times daily. 09/28/21   Elby Showers, MD    Family History Family History  Problem Relation Age of Onset   Cancer Mother    Stomach cancer Mother    Cancer Father    Colon cancer Neg Hx    Esophageal cancer Neg Hx     Social History Social History   Tobacco Use   Smoking status: Former    Types: Cigarettes    Quit date: 12/27/1989    Years since quitting: 32.6   Smokeless tobacco: Never  Vaping Use   Vaping Use: Never used  Substance Use Topics   Alcohol use: Yes    Alcohol/week: 6.0 standard drinks of alcohol    Types: 6 Cans of beer per week    Comment: beer 6 pack per week   Drug use: No     Allergies   Patient has no known allergies.   Review of Systems Review of Systems  HENT:  Positive for hearing loss (right cerumen impaction).      Physical Exam Triage Vital Signs ED Triage Vitals  Enc Vitals Group     BP 08/20/22 1050 (!) 148/82     Pulse Rate 08/20/22 1050 85     Resp 08/20/22 1050 18     Temp 08/20/22 1050 97.6 F (36.4 C)     Temp Source 08/20/22 1050 Oral     SpO2 08/20/22 1050 96 %     Weight --      Height --      Head Circumference --      Peak Flow --      Pain Score 08/20/22 1051 0     Pain Loc --      Pain Edu? --      Excl. in [place]? --    No data found.  Updated Vital Signs BP (!) 148/82 (BP Location: Left Arm)   Pulse 85   Temp 97.6 F (36.4 C) (Oral)   Resp 18   SpO2 96%   Visual Acuity Right Eye Distance:   Left Eye Distance:   Bilateral Distance:    Right Eye Near:   Left Eye Near:    Bilateral Near:     Physical Exam Constitutional:      Appearance: Normal appearance.  HENT:     Right Ear: There is impacted cerumen.     Left Ear: Tympanic membrane and ear canal normal.     Ears:     Comments: Right ear:  After lavage: TM is clear,Canal lightly  Erythematous, Wax removed.    Mouth/Throat:     Mouth: Mucous membranes are moist.     Pharynx: Oropharynx is clear.  Neurological:     Mental Status: He is alert.      UC Treatments / Results  Labs (all labs ordered are listed, but only abnormal results are displayed) Labs Reviewed - No data to display  EKG   Radiology No results found.  Procedures Procedures (including critical care time)  Medications Ordered in UC Medications - No data to display  Initial Impression / Assessment and Plan / UC Course  I have reviewed the triage vital signs and the nursing notes.  Pertinent labs & imaging results that were available during my care of the patient were reviewed by me and considered in my medical decision making (see chart for details).    Plan: The diagnosis will be treated with the following: 1.  Impacted cerumen right ear: A.  Advised to observe and watchful waiting, water sensation will resolve within the next 1 to 2 days. B  Refer.  Internal referral to ENT for eval of wax right ear. 6.  Cortisporin otic drops to the right ear only if needed. 2.  Advised follow-up PCP or return to urgent care as needed. Final Clinical Impressions(s) / UC Diagnoses   Final diagnoses:  Impacted cerumen of right ear     Discharge Instructions      Advised to observe the water sensation should resolve in the ear over the next couple days and return to normal.  Advised follow-up PCP or return to urgent care as needed.    ED Prescriptions     Medication Sig Dispense Auth. Provider   neomycin-polymyxin-hydrocortisone (CORTISPORIN) 3.5-10000-1 OTIC suspension Place 4 drops into the right ear 3 (three) times daily. 10 mL Nyoka Lint, PA-C      PDMP not reviewed this encounter.   Dedric, [place], PA-C 08/20/22 1218    Nyoka Lint, PA-C 08/20/22 [phone]

## 2022-08-25 ENCOUNTER — Encounter: Payer: Self-pay | Admitting: Internal Medicine

## 2022-11-19 DIAGNOSIS — M25561 Pain in right knee: Secondary | ICD-10-CM | POA: Diagnosis not present

## 2022-12-01 DIAGNOSIS — M25561 Pain in right knee: Secondary | ICD-10-CM | POA: Diagnosis not present

## 2022-12-10 DIAGNOSIS — S83281A Other tear of lateral meniscus, current injury, right knee, initial encounter: Secondary | ICD-10-CM | POA: Diagnosis not present

## 2022-12-10 DIAGNOSIS — S83241A Other tear of medial meniscus, current injury, right knee, initial encounter: Secondary | ICD-10-CM | POA: Diagnosis not present

## 2023-01-18 ENCOUNTER — Other Ambulatory Visit: Payer: Self-pay | Admitting: Internal Medicine

## 2023-01-27 ENCOUNTER — Other Ambulatory Visit: Payer: Self-pay | Admitting: Internal Medicine

## 2023-02-19 DIAGNOSIS — L821 Other seborrheic keratosis: Secondary | ICD-10-CM | POA: Diagnosis not present

## 2023-02-19 DIAGNOSIS — Z85828 Personal history of other malignant neoplasm of skin: Secondary | ICD-10-CM | POA: Diagnosis not present

## 2023-02-19 DIAGNOSIS — L570 Actinic keratosis: Secondary | ICD-10-CM | POA: Diagnosis not present

## 2023-02-19 DIAGNOSIS — L309 Dermatitis, unspecified: Secondary | ICD-10-CM | POA: Diagnosis not present

## 2023-02-19 DIAGNOSIS — L820 Inflamed seborrheic keratosis: Secondary | ICD-10-CM | POA: Diagnosis not present

## 2023-02-19 DIAGNOSIS — D225 Melanocytic nevi of trunk: Secondary | ICD-10-CM | POA: Diagnosis not present

## 2023-02-21 ENCOUNTER — Other Ambulatory Visit: Payer: PPO

## 2023-02-21 DIAGNOSIS — E8881 Metabolic syndrome: Secondary | ICD-10-CM | POA: Diagnosis not present

## 2023-02-21 DIAGNOSIS — Z0000 Encounter for general adult medical examination without abnormal findings: Secondary | ICD-10-CM | POA: Diagnosis not present

## 2023-02-21 DIAGNOSIS — E119 Type 2 diabetes mellitus without complications: Secondary | ICD-10-CM | POA: Diagnosis not present

## 2023-02-21 DIAGNOSIS — I10 Essential (primary) hypertension: Secondary | ICD-10-CM | POA: Diagnosis not present

## 2023-02-21 DIAGNOSIS — M5441 Lumbago with sciatica, right side: Secondary | ICD-10-CM | POA: Diagnosis not present

## 2023-02-21 DIAGNOSIS — N401 Enlarged prostate with lower urinary tract symptoms: Secondary | ICD-10-CM | POA: Diagnosis not present

## 2023-02-21 DIAGNOSIS — E1169 Type 2 diabetes mellitus with other specified complication: Secondary | ICD-10-CM

## 2023-02-21 DIAGNOSIS — R351 Nocturia: Secondary | ICD-10-CM | POA: Diagnosis not present

## 2023-02-21 DIAGNOSIS — G8929 Other chronic pain: Secondary | ICD-10-CM

## 2023-02-21 DIAGNOSIS — E785 Hyperlipidemia, unspecified: Secondary | ICD-10-CM | POA: Diagnosis not present

## 2023-02-21 LAB — CBC WITH DIFFERENTIAL/PLATELET
Absolute Monocytes: 524 cells/uL (ref 200–950)
Basophils Absolute: 63 cells/uL (ref 0–200)
Basophils Relative: 1.1 %
Eosinophils Absolute: 200 cells/uL (ref 15–500)
Eosinophils Relative: 3.5 %
HCT: 40.7 % (ref 38.5–50.0)
Hemoglobin: 13.9 g/dL (ref 13.2–17.1)
Lymphs Abs: 1835 cells/uL (ref 850–3900)
MCH: 31.3 pg (ref 27.0–33.0)
MCHC: 34.2 g/dL (ref 32.0–36.0)
MCV: 91.7 fL (ref 80.0–100.0)
MPV: 11 fL (ref 7.5–12.5)
Monocytes Relative: 9.2 %
Neutro Abs: 3078 cells/uL (ref 1500–7800)
Neutrophils Relative %: 54 %
Platelets: 217 10*3/uL (ref 140–400)
RBC: 4.44 10*6/uL (ref 4.20–5.80)
RDW: 12.5 % (ref 11.0–15.0)
Total Lymphocyte: 32.2 %
WBC: 5.7 10*3/uL (ref 3.8–10.8)

## 2023-02-25 ENCOUNTER — Ambulatory Visit (INDEPENDENT_AMBULATORY_CARE_PROVIDER_SITE_OTHER): Payer: PPO | Admitting: Internal Medicine

## 2023-02-25 ENCOUNTER — Encounter: Payer: Self-pay | Admitting: Internal Medicine

## 2023-02-25 VITALS — BP 110/80 | HR 80 | Ht 71.5 in | Wt 252.0 lb

## 2023-02-25 DIAGNOSIS — Z86010 Personal history of colonic polyps: Secondary | ICD-10-CM | POA: Diagnosis not present

## 2023-02-25 DIAGNOSIS — N401 Enlarged prostate with lower urinary tract symptoms: Secondary | ICD-10-CM | POA: Diagnosis not present

## 2023-02-25 DIAGNOSIS — Z0000 Encounter for general adult medical examination without abnormal findings: Secondary | ICD-10-CM

## 2023-02-25 DIAGNOSIS — Z1211 Encounter for screening for malignant neoplasm of colon: Secondary | ICD-10-CM

## 2023-02-25 DIAGNOSIS — E1169 Type 2 diabetes mellitus with other specified complication: Secondary | ICD-10-CM | POA: Diagnosis not present

## 2023-02-25 DIAGNOSIS — Z8659 Personal history of other mental and behavioral disorders: Secondary | ICD-10-CM | POA: Diagnosis not present

## 2023-02-25 DIAGNOSIS — Z23 Encounter for immunization: Secondary | ICD-10-CM | POA: Diagnosis not present

## 2023-02-25 DIAGNOSIS — E785 Hyperlipidemia, unspecified: Secondary | ICD-10-CM

## 2023-02-25 DIAGNOSIS — E119 Type 2 diabetes mellitus without complications: Secondary | ICD-10-CM

## 2023-02-25 DIAGNOSIS — I10 Essential (primary) hypertension: Secondary | ICD-10-CM

## 2023-02-25 DIAGNOSIS — R351 Nocturia: Secondary | ICD-10-CM | POA: Diagnosis not present

## 2023-02-25 LAB — POCT URINALYSIS DIP (CLINITEK)
Bilirubin, UA: NEGATIVE
Blood, UA: NEGATIVE
Glucose, UA: NEGATIVE mg/dL
Ketones, POC UA: NEGATIVE mg/dL
Leukocytes, UA: NEGATIVE
Nitrite, UA: NEGATIVE
POC PROTEIN,UA: NEGATIVE
Spec Grav, UA: 1.01 (ref 1.010–1.025)
Urobilinogen, UA: 0.2 E.U./dL
pH, UA: 7 (ref 5.0–8.0)

## 2023-02-25 LAB — HEMOCCULT GUIAC POC 1CARD (OFFICE): Fecal Occult Blood, POC: NEGATIVE

## 2023-02-25 MED ORDER — CLONAZEPAM 1 MG PO TABS: 1.0000 mg | ORAL_TABLET | Freq: Two times a day (BID) | ORAL | 5 refills | Status: DC | PRN

## 2023-02-25 NOTE — Progress Notes (Signed)
Annual Wellness Visit    Patient Care Team: Margaree Mackintosh, MD as PCP - General (Internal Medicine)  Visit Date: 02/25/23   Chief Complaint  Patient presents with   Medicare Wellness   Annual Exam    Subjective:   Patient: [name] [name], Male    DOB: 1951-04-25, 72 y.o.   MRN: [id number]  [name] [name] is a 72 y.o. Male who presents today for his Annual Wellness Visit. Patient has a past medical history of diabetes mellitus, hypertension, hyperlipidemia, anxiety, depression, gout, obesity, metabolic syndrome.   We reviewed his lab work today. PSA has slightly increased from 2.53 to 3.26. He states that it is sometimes difficult to initiate urination.  History of coronary disease and had coronary calcium scoring in 2021 by Dr. Duke Salvia and his total score was 2708.  He is followed by Dr. Dulce Sellar, cardiologist in [facility] with [facility].  He was placed on rosuvastatin 5 mg daily in September 2022 by Dr. Rennis Golden.  He has nitroglycerin on hand.   He had a CT scan for coronary assessment and there was some evidence of a lesion of the left adrenal gland but further testing revealed this to be a cyst of the superior pole of the left kidney.  He also had a nonobstructive calculus of the superior pole of the right kidney.   Colonoscopy done in 2010 with history of adenomatous colon polyps.  Had repeat study in 2016 and several polyps were removed at that time including 4 tubular adenomas and 2 hyperplastic colon polyps.  He had repeat colonoscopy in January 2023 and 1 polyp was removed which was adenomatous.  Repeat study recommended in 7 years i.e. 2030.   Reminded about annual diabetic eye exam.   History of degenerative arthritis AC joint of the right shoulder 2007 seen by Dr. Teressa Senter.   In March 1999 he had a partial tear of gastrocnemius muscle on the left leg treated with Dr. Levonne Spiller.   He had a cyst removed from his spine in 1977 and it actually may have been a pilonidal  cyst.   History of lumbar disc disease L4-L5 and L5-S1 in 1998.  He presented with lumbar radiculopathy at the time.  He received epidural steroids x3 with improvement. History of back pain status post L2-L3 discectomy March 2014 with right lower extremity radiculopathy.   Social history: He is married.  First wife died of cancer.  This is his second marriage.  Completed 1 year of college.  Enjoys golf.  Does not smoke.  Drinks beer.  2 adult children, a son and a daughter.  He is a self-employed Animator.   Family history: Mother died of stomach cancer at age 47.  Father died of lung cancer at age 23.  His parents died within 6 months of each other when he was 85 or 33 years old.  3 sisters in good health.    Past Medical History:  Diagnosis Date   BPH (benign prostatic hypertrophy)    Depression    Diabetes mellitus without complication (HCC)    ED (erectile dysfunction)    Hyperlipidemia    Hypertension      Family History  Problem Relation Age of Onset   Cancer Mother    Stomach cancer Mother    Cancer Father    Colon cancer Neg Hx    Esophageal cancer Neg Hx      Social History   Social History Narrative  Not on file     Review of Systems  Constitutional:  Negative for fever and malaise/fatigue.  HENT:  Negative for congestion.   Eyes:  Negative for blurred vision.  Respiratory:  Negative for cough and shortness of breath.   Cardiovascular:  Negative for chest pain, palpitations and leg swelling.  Gastrointestinal:  Negative for vomiting.       +Occasional loose stools  Musculoskeletal:  Negative for back pain.  Skin:  Negative for rash.  Neurological:  Negative for loss of consciousness and headaches.      Objective:   Vitals: BP 110/80   Pulse 80   Ht 5' 11.5" (1.816 m)   Wt 252 lb (114.3 kg)   BMI 34.66 kg/m   Physical Exam Vitals and nursing note reviewed. Exam conducted with a chaperone present.  Constitutional:      General: He is  awake. He is not in acute distress.    Appearance: Normal appearance. He is not ill-appearing or toxic-appearing.  HENT:     Head: Normocephalic and atraumatic.     Right Ear: Tympanic membrane, ear canal and external ear normal.     Left Ear: Tympanic membrane, ear canal and external ear normal.     Mouth/Throat:     Pharynx: Oropharynx is clear.  Eyes:     Extraocular Movements: Extraocular movements intact.     Pupils: Pupils are equal, round, and reactive to light.  Neck:     Thyroid: No thyroid mass, thyromegaly or thyroid tenderness.     Vascular: No carotid bruit.  Cardiovascular:     Rate and Rhythm: Normal rate and regular rhythm. No extrasystoles are present.    Pulses:          Dorsalis pedis pulses are 2+ on the right side and 2+ on the left side.     Heart sounds: Normal heart sounds. No murmur heard.    No friction rub. No gallop.     Comments: Pulses excellent. Superficial varicosities of bilateral lower extremities related to age. Pulmonary:     Effort: Pulmonary effort is normal.     Breath sounds: Normal breath sounds. No decreased breath sounds, wheezing, rhonchi or rales.  Chest:     Chest wall: No mass.  Abdominal:     Palpations: Abdomen is soft.     Tenderness: There is no abdominal tenderness.     Hernia: No hernia is present.  Genitourinary:    Prostate: Normal.     Rectum: Normal.     Comments: Prostate exam normal. Hemoccult x1 negative. Musculoskeletal:     Cervical back: Normal range of motion.     Right lower leg: No edema.     Left lower leg: No edema.  Feet:     Right foot:     Toenail Condition: Fungal disease present.    Left foot:     Toenail Condition: Fungal disease present.    Comments: Fungal disease of great toes bilaterally. Lymphadenopathy:     Cervical: No cervical adenopathy.     Upper Body:     Right upper body: No supraclavicular adenopathy.     Left upper body: No supraclavicular adenopathy.  Skin:    General: Skin is  warm and dry.  Neurological:     General: No focal deficit present.     Mental Status: He is alert and oriented to person, place, and time. Mental status is at baseline.     Cranial Nerves: Cranial nerves 2-12 are intact.  Sensory: Sensation is intact.     Motor: Motor function is intact.     Coordination: Coordination is intact.     Gait: Gait is intact.     Deep Tendon Reflexes: Reflexes are normal and symmetric.  Psychiatric:        Attention and Perception: Attention normal.        Mood and Affect: Mood normal.        Speech: Speech normal.        Behavior: Behavior normal. Behavior is cooperative.        Thought Content: Thought content normal.        Cognition and Memory: Cognition and memory normal.        Judgment: Judgment normal.      Most recent functional status assessment:    02/25/2023    9:54 AM  In your present state of health, do you have any difficulty performing the following activities:  Hearing? 0  Vision? 0  Difficulty concentrating or making decisions? 0  Walking or climbing stairs? 0  Dressing or bathing? 0  Doing errands, shopping? 0  Preparing Food and eating ? N  Using the Toilet? N  In the past six months, have you accidently leaked urine? N  Do you have problems with loss of bowel control? N  Managing your Medications? N  Managing your Finances? N  Housekeeping or managing your Housekeeping? N   Most recent fall risk assessment:    02/25/2023    9:58 AM  Fall Risk   Falls in the past year? 0  Number falls in past yr: 0  Injury with Fall? 0  Risk for fall due to : No Fall Risks    Most recent depression screenings:    02/25/2023   10:07 AM 08/15/2022    9:21 AM  PHQ 2/9 Scores  PHQ - 2 Score 0 0   Most recent cognitive screening:    02/25/2023    9:57 AM  6CIT Screen  What Year? 0 points  What month? 0 points  What time? 0 points  Count back from 20 0 points  Months in reverse 0 points  Repeat phrase 0 points  Total Score  0 points     Results:   Studies obtained and personally reviewed by me:  He had repeat colonoscopy in January 2023 and 1 polyp was removed which was adenomatous.  Repeat study recommended in 7 years i.e. 2030.   Labs:       Component Value Date/Time   NA 131 (L) 02/21/2023 1013   NA 132 (L) 04/10/2020 1348   K 4.6 02/21/2023 1013   CL 94 (L) 02/21/2023 1013   CO2 30 02/21/2023 1013   GLUCOSE 97 02/21/2023 1013   BUN 13 02/21/2023 1013   BUN 13 04/10/2020 1348   CREATININE 1.04 02/21/2023 1013   CALCIUM 9.9 02/21/2023 1013   PROT 6.5 02/21/2023 1013   PROT 6.7 04/10/2020 1348   ALBUMIN 4.7 04/10/2020 1348   AST 18 02/21/2023 1013   ALT 17 02/21/2023 1013   ALKPHOS 24 (L) 04/10/2020 1348   BILITOT 0.6 02/21/2023 1013   BILITOT 0.4 04/10/2020 1348   GFRNONAA 79 04/10/2020 1348   GFRNONAA 85 01/25/2020 1218   GFRAA 91 04/10/2020 1348   GFRAA 99 01/25/2020 1218     Lab Results  Component Value Date   WBC 5.7 02/21/2023   HGB 13.9 02/21/2023   HCT 40.7 02/21/2023   MCV 91.7 02/21/2023   PLT  217 02/21/2023    Lab Results  Component Value Date   CHOL 122 02/21/2023   HDL 44 02/21/2023   LDLCALC 63 02/21/2023   TRIG 74 02/21/2023   CHOLHDL 2.8 02/21/2023    Lab Results  Component Value Date   HGBA1C 6.1 (H) 02/21/2023     No results found for: "TSH"   Lab Results  Component Value Date   PSA 3.26 02/21/2023   PSA 2.53 02/01/2022   PSA 3.74 01/30/2021   Assessment & Plan:   Onychomycosis, bilateral feet - noted on great toes bilaterally. Recommend follow-up with podiatrist, Dr. Charlsie Merles.  Type 2 diabetes mellitus - Hemoglobin A1c is excellent at 6.1%.  He is currently on Januvia 100 mg daily and metformin 1000 mg twice daily.  He also uses Lantus insulin 22 units under the skin daily.   Essential hypertension - treated with olmesartan 40 mg daily, HCTZ 25 mg daily, and amlodipine 5 mg daily.    Mixed hyperlipidemia - treated with Crestor 5 mg daily,  fenofibrate 160 mg daily. Lipid panel normal 02/2023 with total cholesterol 122, HDL 44, triglycerides 74, LDL 63.   Coronary artery disease - had coronary calcium scoring in 2021 by Dr. Duke Salvia and his total score was 2708.  He is followed by Dr. Dulce Sellar, cardiologist in [facility].  He was placed on rosuvastatin 5 mg daily in September 2022 by Dr. Rennis Golden.  He has nitroglycerin on hand.   BPH - treated with Flomax 0.4 mg daily and stable. PSA has slightly increased from 2.53 to 3.26. He states that it is sometimes difficult to initiate urination.  Anxiety/depression - treated with Klonopin and Wellbutrin. Stable.  Health maintenance: Vaccines discussed. His tetanus immunization is due in 03/2023 at his pharmacy. Has not had Shingrix vaccine or RSV vaccine. We will administer pneumococcal 20 vaccine today. He has had Prevnar 13 and pneumococcal 23. Last COVID-vaccine on file was 2021. He has had a total of 3 of these. He gets annual flu vaccine.     Reminded about annual diabetic eye exam.  Colonoscopy completed January 2023 and 1 polyp was removed which was adenomatous.  Repeat study recommended in 7 years.  Labs 02/2023 reviewed with patient:  Hemoglobin A1c 6.1%. CBC within normal limits. CMP is abnormal with low sodium 131, chloride 94, alkaline phosphatase 20. Kidney and liver function normal. Microalbumin/creatinine normal. PSA normal at 3.26. Lipid panel normal with total cholesterol 122, HDL 44, triglycerides 74, LDL 63.  Medication refills - He is requesting refills for his amlodipine, allopurinol, Wellbutrin, klonopin.  Plan:  He will continue with medications described above.  He will try to watch his diet and stay physically active. Hemoccult negative x1. Pneumococcal 20 was administered today. Recommended follow-up with podiatry for onychomycosis. Recommend fiber supplement such as metamucil for occasional loose stools. Return in 6 months, or sooner as needed.       Annual wellness visit done today including the all of the following: Reviewed patient's Family Medical History Reviewed and updated list of patient's medical providers Assessment of cognitive impairment was done Assessed patient's functional ability Established a written schedule for health screening services Health Risk Assessent Completed and Reviewed  Discussed health benefits of physical activity, and encouraged him to engage in regular exercise appropriate for his age and condition.      I,Mathew Stumpf,acting as a Neurosurgeon for Margaree Mackintosh, MD.,have documented all relevant documentation on the behalf of Margaree Mackintosh, MD,as directed by  Margaree Mackintosh, MD while in the presence of Margaree Mackintosh, MD.   I, Margaree Mackintosh, MD, have reviewed all documentation for this visit. The documentation on 02/27/23 for the exam, diagnosis, procedures, and orders are all accurate and complete.

## 2023-03-04 ENCOUNTER — Other Ambulatory Visit: Payer: Self-pay | Admitting: Internal Medicine

## 2023-03-04 DIAGNOSIS — E119 Type 2 diabetes mellitus without complications: Secondary | ICD-10-CM | POA: Diagnosis not present

## 2023-03-04 LAB — HM DIABETES EYE EXAM

## 2023-03-05 ENCOUNTER — Other Ambulatory Visit: Payer: Self-pay

## 2023-03-05 ENCOUNTER — Encounter: Payer: Self-pay | Admitting: Internal Medicine

## 2023-03-05 MED ORDER — BUPROPION HCL ER (XL) 300 MG PO TB24: 300.0000 mg | ORAL_TABLET | Freq: Every day | ORAL | 2 refills | Status: DC

## 2023-03-15 NOTE — Patient Instructions (Addendum)
It was a pleasure to see you today. Medical issues are stable. Blood pressure is well controlled as is Diabetes mellitus. Continue  Cardiology follow up for elevated coronary calcium scoring. Continue current meds. Vaccines discussed. Follow up in 6 months. Colonoscopy is up to date.

## 2023-03-29 DIAGNOSIS — M25562 Pain in left knee: Secondary | ICD-10-CM | POA: Diagnosis not present

## 2023-04-20 ENCOUNTER — Encounter: Payer: Self-pay | Admitting: Pharmacist

## 2023-04-20 NOTE — Progress Notes (Signed)
Pharmacy Quality Measure Review  This patient is appearing on a report for being at risk of failing the adherence measure for hypertension (ACEi/ARB) medications this calendar year.   Medication: olmesartan 40 mg Last fill date: 7/15 for 90 day supply  Insurance report was not up to date. No action needed at this time.   Jarrett Ables, PharmD PGY-1 Pharmacy Resident

## 2023-05-04 ENCOUNTER — Other Ambulatory Visit: Payer: Self-pay | Admitting: Internal Medicine

## 2023-05-15 ENCOUNTER — Other Ambulatory Visit: Payer: Self-pay | Admitting: Internal Medicine

## 2023-05-16 ENCOUNTER — Other Ambulatory Visit: Payer: Self-pay | Admitting: Internal Medicine

## 2023-05-26 ENCOUNTER — Other Ambulatory Visit: Payer: Self-pay | Admitting: Internal Medicine

## 2023-07-07 ENCOUNTER — Other Ambulatory Visit: Payer: Self-pay | Admitting: Internal Medicine

## 2023-07-23 DIAGNOSIS — R0981 Nasal congestion: Secondary | ICD-10-CM | POA: Diagnosis not present

## 2023-07-23 DIAGNOSIS — R051 Acute cough: Secondary | ICD-10-CM | POA: Diagnosis not present

## 2023-07-23 DIAGNOSIS — J028 Acute pharyngitis due to other specified organisms: Secondary | ICD-10-CM | POA: Diagnosis not present

## 2023-07-23 DIAGNOSIS — R5081 Fever presenting with conditions classified elsewhere: Secondary | ICD-10-CM | POA: Diagnosis not present

## 2023-07-23 DIAGNOSIS — R5381 Other malaise: Secondary | ICD-10-CM | POA: Diagnosis not present

## 2023-07-23 DIAGNOSIS — J157 Pneumonia due to Mycoplasma pneumoniae: Secondary | ICD-10-CM | POA: Diagnosis not present

## 2023-07-23 DIAGNOSIS — R0982 Postnasal drip: Secondary | ICD-10-CM | POA: Diagnosis not present

## 2023-08-14 ENCOUNTER — Other Ambulatory Visit: Payer: Self-pay | Admitting: Internal Medicine

## 2023-08-24 ENCOUNTER — Other Ambulatory Visit: Payer: Self-pay | Admitting: Internal Medicine

## 2023-08-25 ENCOUNTER — Other Ambulatory Visit: Payer: Self-pay

## 2023-08-26 ENCOUNTER — Other Ambulatory Visit: Payer: Self-pay

## 2023-08-26 DIAGNOSIS — E119 Type 2 diabetes mellitus without complications: Secondary | ICD-10-CM

## 2023-08-26 MED ORDER — BD PEN NEEDLE MINI U/F 31G X 5 MM MISC: 99 refills | Status: AC

## 2023-09-03 ENCOUNTER — Other Ambulatory Visit: Payer: Self-pay | Admitting: Internal Medicine

## 2023-09-05 MED ORDER — BD PEN NEEDLE MINI U/F 31G X 5 MM MISC: 99 refills | Status: AC

## 2023-09-16 DIAGNOSIS — Z85828 Personal history of other malignant neoplasm of skin: Secondary | ICD-10-CM | POA: Diagnosis not present

## 2023-09-16 DIAGNOSIS — L300 Nummular dermatitis: Secondary | ICD-10-CM | POA: Diagnosis not present

## 2023-10-12 ENCOUNTER — Other Ambulatory Visit: Payer: Self-pay | Admitting: Internal Medicine

## 2023-10-22 DIAGNOSIS — Z85828 Personal history of other malignant neoplasm of skin: Secondary | ICD-10-CM | POA: Diagnosis not present

## 2023-10-22 DIAGNOSIS — L905 Scar conditions and fibrosis of skin: Secondary | ICD-10-CM | POA: Diagnosis not present

## 2023-10-22 DIAGNOSIS — L570 Actinic keratosis: Secondary | ICD-10-CM | POA: Diagnosis not present

## 2023-10-28 ENCOUNTER — Telehealth: Payer: Self-pay | Admitting: Internal Medicine

## 2023-10-28 ENCOUNTER — Encounter: Payer: Self-pay | Admitting: Internal Medicine

## 2023-10-28 DIAGNOSIS — L309 Dermatitis, unspecified: Secondary | ICD-10-CM

## 2023-10-28 MED ORDER — TRIAMCINOLONE ACETONIDE 0.1 % EX CREA: TOPICAL_CREAM | Freq: Two times a day (BID) | CUTANEOUS | 2 refills | Status: DC

## 2023-10-28 MED ORDER — TRIAMCINOLONE ACETONIDE 0.1 % EX CREA: TOPICAL_CREAM | Freq: Two times a day (BID) | CUTANEOUS | 1 refills | Status: AC

## 2023-10-28 NOTE — Telephone Encounter (Signed)
 Patient requesting refill on eczema medication. Sending in triamcinolone cream mixed 1:1 with Cerave to apply twice daily for eczema. MJB, MD

## 2023-11-07 ENCOUNTER — Telehealth: Payer: Self-pay

## 2023-11-07 ENCOUNTER — Telehealth: Payer: Self-pay | Admitting: *Deleted

## 2023-11-07 NOTE — Telephone Encounter (Signed)
 Pharmacist Lennice Quivers informed me they dont have Cerave and they have Cedafil in replace of it. Dr. Liane Redman gave me a verbal okay with this alternative.

## 2023-11-07 NOTE — Telephone Encounter (Signed)
 Spoke to pharmacist and got medication changed to Cedafil and not Cerave which they were out of per [employer]. Dr. Liane Redman gave me verbal okay to use this alternative.

## 2023-11-07 NOTE — Telephone Encounter (Signed)
 Copied from CRM [id number]. Topic: Clinical - Prescription Issue >> Oct 28, 2023  5:13 PM Juleen Oakland F wrote: Reason for CRM: [facility] called to verify the quantity of patients triamcinolone  that was sent to pharmacy today - please call [phone]

## 2023-11-08 ENCOUNTER — Other Ambulatory Visit: Payer: Self-pay | Admitting: Internal Medicine

## 2023-11-10 ENCOUNTER — Other Ambulatory Visit: Payer: Self-pay

## 2023-11-10 DIAGNOSIS — K2100 Gastro-esophageal reflux disease with esophagitis, without bleeding: Secondary | ICD-10-CM

## 2023-11-10 DIAGNOSIS — I10 Essential (primary) hypertension: Secondary | ICD-10-CM

## 2023-11-10 DIAGNOSIS — E8881 Metabolic syndrome: Secondary | ICD-10-CM

## 2023-11-10 MED ORDER — HYDROCHLOROTHIAZIDE 25 MG PO TABS: 25.0000 mg | ORAL_TABLET | Freq: Every day | ORAL | 2 refills | Status: DC

## 2023-11-10 MED ORDER — ALLOPURINOL 300 MG PO TABS: ORAL_TABLET | ORAL | 2 refills | Status: DC

## 2023-11-25 ENCOUNTER — Other Ambulatory Visit: Payer: Self-pay

## 2023-11-25 MED ORDER — ONETOUCH VERIO FLEX SYSTEM W/DEVICE KIT: PACK | 11 refills | Status: AC

## 2023-12-24 DIAGNOSIS — M1712 Unilateral primary osteoarthritis, left knee: Secondary | ICD-10-CM | POA: Diagnosis not present

## 2023-12-24 DIAGNOSIS — Z8739 Personal history of other diseases of the musculoskeletal system and connective tissue: Secondary | ICD-10-CM | POA: Diagnosis not present

## 2024-02-02 ENCOUNTER — Other Ambulatory Visit: Payer: Self-pay | Admitting: Internal Medicine

## 2024-02-11 ENCOUNTER — Other Ambulatory Visit: Payer: Self-pay | Admitting: Internal Medicine

## 2024-02-15 ENCOUNTER — Other Ambulatory Visit: Payer: Self-pay | Admitting: Internal Medicine

## 2024-02-24 ENCOUNTER — Encounter: Payer: Self-pay | Admitting: Internal Medicine

## 2024-02-24 ENCOUNTER — Other Ambulatory Visit

## 2024-02-24 DIAGNOSIS — Z0000 Encounter for general adult medical examination without abnormal findings: Secondary | ICD-10-CM

## 2024-02-24 DIAGNOSIS — E785 Hyperlipidemia, unspecified: Secondary | ICD-10-CM | POA: Diagnosis not present

## 2024-02-24 DIAGNOSIS — E119 Type 2 diabetes mellitus without complications: Secondary | ICD-10-CM | POA: Diagnosis not present

## 2024-02-24 DIAGNOSIS — N401 Enlarged prostate with lower urinary tract symptoms: Secondary | ICD-10-CM | POA: Diagnosis not present

## 2024-02-24 DIAGNOSIS — I10 Essential (primary) hypertension: Secondary | ICD-10-CM | POA: Diagnosis not present

## 2024-02-24 DIAGNOSIS — E8881 Metabolic syndrome: Secondary | ICD-10-CM | POA: Diagnosis not present

## 2024-02-24 DIAGNOSIS — R351 Nocturia: Secondary | ICD-10-CM | POA: Diagnosis not present

## 2024-02-24 DIAGNOSIS — E1169 Type 2 diabetes mellitus with other specified complication: Secondary | ICD-10-CM | POA: Diagnosis not present

## 2024-02-24 NOTE — Progress Notes (Signed)
 Annual Wellness Visit   Patient Care Team: Jensen Cheramie, Ronal PARAS, MD as PCP - General (Internal Medicine) Abigail Maude POUR (Optometry)  Visit Date: 02/26/24   Chief Complaint  Patient presents with   Annual Exam   Medicare Wellness   Subjective:  Patient: [name] [name], Male DOB: 01-16-1951, 73 y.o. MRN: [id number]  [name] [name] is a 73 y.o. Male who presents today for his Annual Wellness Visit. Patient has Colonic Polyps, Adenomatous; Gout; Obesity; Essential Hypertension; Musculoskeletal Pain; Depression; Metabolic Syndrome; Cervical Radiculopathy; Insulin  Dependent Diabetes Mellitus; Hypertriglyceridemia; Chest Pain, Uncertain Etiology; and Hyperlipidemia.  History of Hypertension treated with Amlodipine  5 mg daily and Olmesartan  40 mg daily; Dependent Edema treated with HCTZ 25 mg daily. Blood Pressure: normotensive today at 120/80. 02/24/2024 Potassium WNL at 4.6.   History of Hyperlipidemia treated with Rosuvastatin  5 mg daily and Fenofibrate  150 mg daily. 02/24/2024 Lipid Panel: WNL. 2021 Coronary Calcium  Score: 2708.   History of Diabetes Mellitus, type II treated with Metformin  1000 mg twice daily, Januvia  100 mg daily, and Lantus  22 units injected daily. 02/24/2024 HgbA1c 5.3, Glucose 86, and Albumin <0.2. Eye exam 03/04/2923 at [facility] did not detect diabetic retinopathy. Since 02/2023, when he weighed 252 lbs, he has lost 18 pounds and today weighs 234 lbsBMI 31.96.   History of Anxiety/Depression treated with Wellbutrin -XL 300 mg daily and Klonopin  1 mg twice daily as needed.  History of Lumbar Disc Disease L4-S1 1998; Lumbar Radiculopathy s/p L2-3 Discectomy 09/2012; Back Pain treated with Flexeril  10 mg at bedtime as needed.  History of Gout treated with Allopurinol  300 mg daily.  History of Eczema treated with Triamcinolone -Cerave cream applied twice daily.    Labs 02/24/2024 CBC w/ Differential: WNL   Comprehensive Metabolic Panel, compared to 03/2023: Sodium 134,  elevated from 131; Chloride 97, elevated from 94; otherwise WNL  Colonoscopy 07/27/2021  removed a diminutive sessile polyp from the ascending colon; Scattered Small- Mouthed Diverticula found throughout the entire colon; colon was spastic which prolonged the exam; Internal Hemorrhoids; exam was otherwise without abnormality with repeat recommendation of 2030.  History of Benign Prostatic Hypertrophy managed with Flomax ; PSA  2.83  02/24/2024.  Vaccine Counseling: Due for Influenza, Shingrix 1st dose, and Tdap; UTD on PNA.  Review of Systems  Constitutional:  Negative for chills, fever, malaise/fatigue and weight loss.  HENT:  Negative for hearing loss, sinus pain and sore throat.   Respiratory:  Negative for cough, hemoptysis and shortness of breath.   Cardiovascular:  Negative for chest pain, palpitations, leg swelling and PND.  Gastrointestinal:  Negative for abdominal pain, constipation, diarrhea, heartburn, nausea and vomiting.  Genitourinary:  Negative for dysuria, frequency and urgency.  Musculoskeletal:  Negative for back pain, myalgias and neck pain.  Skin:  Negative for itching and rash.  Neurological:  Negative for dizziness, tingling, seizures and headaches.  Endo/Heme/Allergies:  Negative for polydipsia.  Psychiatric/Behavioral:  Negative for depression. The patient is not nervous/anxious.    Objective:  Vitals: body mass index is 31.96 kg/m. Today's Vitals   02/26/24 0948 02/26/24 0954  BP: 120/80   Pulse: 78   SpO2: 97%   Weight: 234 lb (106.1 kg)   Height: 5' 11.75 (1.822 m)   PainSc: 0-No pain 0-No pain   Physical Exam Vitals and nursing note reviewed. Exam conducted with a chaperone present Gae [place], CMA).  Constitutional:      General: He is awake. He is not in acute distress.    Appearance: Normal  appearance. He is not ill-appearing or toxic-appearing.  HENT:     Head: Normocephalic and atraumatic.     Right Ear: Tympanic membrane, ear canal and  external ear normal.     Left Ear: Tympanic membrane, ear canal and external ear normal.     Mouth/Throat:     Pharynx: Oropharynx is clear.  Eyes:     Extraocular Movements: Extraocular movements intact.     Pupils: Pupils are equal, round, and reactive to light.  Neck:     Thyroid: No thyroid mass, thyromegaly or thyroid tenderness.     Vascular: No carotid bruit.  Cardiovascular:     Rate and Rhythm: Normal rate and regular rhythm. No extrasystoles are present.    Pulses:          Dorsalis pedis pulses are 2+ on the right side and 2+ on the left side.       Posterior tibial pulses are 2+ on the right side and 2+ on the left side.     Heart sounds: Normal heart sounds. No murmur heard.    No friction rub. No gallop.  Pulmonary:     Effort: Pulmonary effort is normal.     Breath sounds: Normal breath sounds. No decreased breath sounds, wheezing, rhonchi or rales.  Chest:     Chest wall: No mass.  Abdominal:     Palpations: Abdomen is soft. There is no hepatomegaly, splenomegaly or mass.     Tenderness: There is no abdominal tenderness.     Hernia: No hernia is present.  Genitourinary:    Prostate: Normal. Not enlarged, not tender and no nodules present.     Rectum: Normal. Guaiac result negative.  Musculoskeletal:     Cervical back: Normal range of motion.     Right lower leg: No edema.     Left lower leg: No edema.  Lymphadenopathy:     Cervical: No cervical adenopathy.     Upper Body:     Right upper body: No supraclavicular adenopathy.     Left upper body: No supraclavicular adenopathy.  Skin:    General: Skin is warm and dry.  Neurological:     General: No focal deficit present.     Mental Status: He is alert and oriented to person, place, and time. Mental status is at baseline.     Cranial Nerves: Cranial nerves 2-12 are intact.     Sensory: Sensation is intact.     Motor: Motor function is intact.     Coordination: Coordination is intact.     Gait: Gait is  intact.     Deep Tendon Reflexes: Reflexes are normal and symmetric.  Psychiatric:        Attention and Perception: Attention normal.        Mood and Affect: Mood normal.        Speech: Speech normal.        Behavior: Behavior normal. Behavior is cooperative.        Thought Content: Thought content normal.        Cognition and Memory: Cognition and memory normal.        Judgment: Judgment normal.    Current Outpatient Medications  Medication Instructions   allopurinol (ZYLOPRIM) 300 MG tablet TAKE ONE TABLET BY MOUTH ONE TIME DAILY   amLODipine (NORVASC) 5 MG tablet TAKE ONE TABLET BY MOUTH ONE TIME DAILY   Blood Glucose Monitoring Suppl (ONETOUCH VERIO FLEX SYSTEM) w/Device KIT Use to test blood sugar   buPROPion (  WELLBUTRIN  XL) 300 mg, Oral, Daily   clonazePAM  (KLONOPIN ) 1 MG tablet TAKE ONE TABLET BY MOUTH TWICE A DAY AS NEEDED FOR ANXIETY   cyclobenzaprine  (FLEXERIL ) 10 mg, Oral, At bedtime PRN   fenofibrate  160 mg, Oral, Daily   hydrochlorothiazide  (HYDRODIURIL ) 25 mg, Oral, Daily   hydrochlorothiazide  (HYDRODIURIL ) 25 mg, Oral, Daily   Insulin  Pen Needle (B-D UF III MINI PEN NEEDLES) 31G X 5 MM MISC Check glucose twice a day.   Insulin  Pen Needle (B-D UF III MINI PEN NEEDLES) 31G X 5 MM MISC Check glucose 4 times daily   Januvia  100 mg, Oral, Daily   Lancets (ONETOUCH DELICA PLUS LANCET33G) MISC USE TO TEST ONCE DAILY   LANTUS  SOLOSTAR 100 UNIT/ML Solostar Pen INJECT 22 UNITS INTO THE SKIN DAILY   metFORMIN  (GLUCOPHAGE ) 1000 MG tablet TAKE ONE TABLET BY MOUTH TWICE A DAY WITH A MEAL   Multiple Vitamin (MULTIVITAMIN) tablet 1 tablet, Daily   olmesartan  (BENICAR ) 40 mg, Oral, Daily   ONETOUCH VERIO test strip USE TO TEST BLOOD SUGAR ONCE DAILY   rosuvastatin  (CRESTOR ) 5 mg, Oral, Daily   tamsulosin  (FLOMAX ) 0.4 mg, Oral, Daily   triamcinolone  0.1%-Cerave equivalent 1:1 cream mixture Topical, 2 times daily   Past Medical History:  Diagnosis Date   BPH (benign prostatic  hypertrophy)    Depression    Diabetes mellitus without complication (HCC)    ED (erectile dysfunction)    Hyperlipidemia    Hypertension    Medical/Surgical History Narrative:  Allergic/Intolerant to: No Known Allergies  2021 - Coincidental finding of a lesion of the left adrenal gland and nonobstructive calculus of the superior pole of the right kidney on coronary over-read, however on further testing revealed lesion to be a cyst of the superior pole of the left kidney.  2007 - Degenerative Arthritis AC Joint, Right Shoulder seen by Dr. Leonor  1999 - In March, had partial tear of gastrocnemius muscle, left leg treated by Dr. Crayton  1977 - Cyst removed from spine, possibly a pilonidal cyst   Past Surgical History:  Procedure Laterality Date   BACK SURGERY     COLONOSCOPY  2016   Dr.Armbruster   NECK SURGERY     Family History  Problem Relation Age of Onset   Cancer Mother    Stomach cancer Mother    Cancer Father    Colon cancer Neg Hx    Esophageal cancer Neg Hx    Family History Narrative: No Family History of Esophageal or Colon Cancer Father, deceased age 63 due to Lung Cancer Mother, deceased age 92 due to Stomach Cancer Siblings, 3 Sister(s) in good health as far as is known Social History   Social History Narrative   Remarried - 1st wife died due to cancer. Completed 1 year of college. Non-smoker, drinks beer. Enjoys golf. 2 adult children, 1 son and 1 daughter. Self-employed Animator.   Most Recent Health Risks Assessment:   Medicare Risk at Home - 02/26/24 0945     Any stairs in or around the home? Yes    If so, are there any without handrails? No    Home free of loose throw rugs in walkways, pet beds, electrical cords, etc? Yes    Adequate lighting in your home to reduce risk of falls? Yes    Life alert? No    Use of a cane, walker or w/c? No    Grab bars in the bathroom? No    Shower chair or bench  in shower? Yes    Elevated toilet seat or a  handicapped toilet? No         Most Recent Social Determinants of Health (Including Hx of Tobacco, Alcohol, and Drug Use) SDOH Screenings   Food Insecurity: No Food Insecurity (02/26/2024)  Housing: Low Risk  (02/26/2024)  Transportation Needs: No Transportation Needs (02/26/2024)  Utilities: Not At Risk (02/26/2024)  Alcohol Screen: Low Risk  (02/26/2024)  Depression (PHQ2-9): Low Risk  (02/26/2024)  Financial Resource Strain: Low Risk  (02/26/2024)  Physical Activity: Sufficiently Active (02/26/2024)  Social Connections: Unknown (02/26/2024)  Recent Concern: Social Connections - Moderately Isolated (02/21/2024)  Stress: No Stress Concern Present (02/26/2024)  Recent Concern: Stress - Stress Concern Present (02/21/2024)  Tobacco Use: Medium Risk (02/26/2024)  Health Literacy: Adequate Health Literacy (02/26/2024)   Social History   Tobacco Use   Smoking status: Former    Current packs/day: 0.00    Types: Cigarettes    Quit date: 12/27/1989    Years since quitting: 34.1   Smokeless tobacco: Never  Vaping Use   Vaping status: Never Used  Substance Use Topics   Alcohol use: Yes    Alcohol/week: 6.0 standard drinks of alcohol    Types: 6 Cans of beer per week    Comment: beer 6 pack per week   Drug use: No   Most Recent Functional Status Assessment:    02/26/2024    9:45 AM  In your present state of health, do you have any difficulty performing the following activities:  Hearing? 0  Vision? 0  Difficulty concentrating or making decisions? 0  Walking or climbing stairs? 0  Dressing or bathing? 0  Doing errands, shopping? 0  Preparing Food and eating ? N  Using the Toilet? N  In the past six months, have you accidently leaked urine? N  Do you have problems with loss of bowel control? N  Managing your Medications? N  Managing your Finances? N  Housekeeping or managing your Housekeeping? N   Most Recent Fall Risk Assessment:    02/26/2024   10:05 AM  Fall Risk   Falls in the  past year? 0  Number falls in past yr: 0  Injury with Fall? 0  Risk for fall due to : No Fall Risks  Follow up Falls prevention discussed;Education provided;Falls evaluation completed   Most Recent Anxiety/Depression Screenings:    02/26/2024   10:03 AM 02/25/2023   10:07 AM  PHQ 2/9 Scores  PHQ - 2 Score 0 0   Most Recent Cognitive Screening:    02/26/2024    9:51 AM  6CIT Screen  What Year? 0 points  What month? 0 points  What time? 0 points  Count back from 20 0 points  Months in reverse 0 points  Repeat phrase 0 points  Total Score 0 points   Most Recent Vision/Hearing Screenings:No results found. Results:  Studies Obtained And Personally Reviewed By Me:  Diabetic Foot Exam - Simple   Simple Foot Form Diabetic Foot exam was performed with the following findings: Yes 02/26/2024 10:28 AM  Visual Inspection No deformities, no ulcerations, no other skin breakdown bilaterally: Yes Sensation Testing Intact to touch and monofilament testing bilaterally: Yes Pulse Check Posterior Tibialis and Dorsalis pulse intact bilaterally: Yes Comments    2021 Coronary Calcium  Score: 2708  Colonoscopy 07/27/2021  removed a diminutive sessile polyp from the ascending colon; Scattered Small- Mouthed Diverticula found throughout the entire colon; colon was spastic which prolonged the exam; Internal  Hemorrhoids; exam was otherwise without abnormality.  Labs:  CBC w/ Differential Lab Results  Component Value Date   WBC 5.0 02/24/2024   RBC 4.37 02/24/2024   HGB 13.8 02/24/2024   HCT 41.7 02/24/2024   PLT 213 02/24/2024   MCV 95.4 02/24/2024   MCH 31.6 02/24/2024   MCHC 33.1 02/24/2024   RDW 12.8 02/24/2024   MPV 11.0 02/24/2024   LYMPHSABS 1,835 02/21/2023   MONOABS 424 10/24/2016   BASOSABS 60 02/24/2024    Comprehensive Metabolic Panel Lab Results  Component Value Date   NA 134 (L) 02/24/2024   K 4.6 02/24/2024   CL 97 (L) 02/24/2024   CO2 31 02/24/2024   GLUCOSE 86  02/24/2024   BUN 9 02/24/2024   CREATININE 0.89 02/24/2024   CALCIUM  9.6 02/24/2024   PROT 6.4 02/24/2024   ALBUMIN 4.7 04/10/2020   AST 25 02/24/2024   ALT 20 02/24/2024   ALKPHOS 24 (L) 04/10/2020   BILITOT 0.8 02/24/2024   EGFR 77 02/21/2023   GFRNONAA 79 04/10/2020   Lipid Panel  Lab Results  Component Value Date   CHOL 141 02/24/2024   HDL 51 02/24/2024   LDLCALC 77 02/24/2024   TRIG 51 02/24/2024   A1c Lab Results  Component Value Date   HGBA1C 5.3 02/24/2024   PSA Lab Results  Component Value Date   PSA 2.83 02/24/2024   PSA 3.26 02/21/2023   PSA 2.53 02/01/2022     Assessment & Plan:  Other Labs Reviewed today: CBC w/ Differential: WNL   Comprehensive Metabolic Panel, compared to 03/2023: Sodium 134, elevated from 131; Chloride 97, elevated from 94; otherwise WNL  Hypertension treated with Amlodipine  5 mg daily and Olmesartan  40 mg daily. Blood Pressure: normotensive today at 120/80.  Dependent Edema treated with HCTZ 25 mg daily. 02/24/2024 Potassium WNL at 4.6.   Hyperlipidemia treated with Rosuvastatin  5 mg daily and Fenofibrate  150 mg daily. 02/24/2024 Lipid Panel: WNL. 2021 Coronary Calcium  Score: 2708.   Diabetes Mellitus, type II treated with Metformin  1000 mg twice daily, Januvia  100 mg daily, and Lantus  22 units injected daily. 02/24/2024 HgbA1c 5.3, Glucose 86, and Albumin <0.2. Eye exam 03/04/2923 at [facility] did not detect diabetic retinopathy. Since 02/2023, when he weighed 252 lbs, he has lost 18 pounds and today weighs 234 lbsBMI 31.96.   Anxiety/Depression treated with Wellbutrin -XL 300 mg daily and Klonopin  1 mg twice daily as needed.  Lumbar Disc Disease L4-S1; Lumbar Radiculopathy s/p L2-3 Discectomy 09/2012; Back Pain treated with Flexeril  10 mg at bedtime as needed.  Gout treated with Allopurinol  300 mg daily.  Eczema treated with Triamcinolone -Cerave cream applied twice daily.    Colonoscopy 07/27/2021  removed a diminutive sessile  polyp from the ascending colon; Scattered Small- Mouthed Diverticula found throughout the entire colon; colon was spastic which prolonged the exam; Internal Hemorrhoids; exam was otherwise without abnormality with repeat recommendation of 2030.  Benign Prostatic Hypertrophy managed with Flomax ; PSA  2.83  02/24/2024.  Vaccine Counseling: Due for Influenza, Shingrix 1st dose, and Tdap; UTD on PNA.  Discussed, and he will consider updating Influenza this fall.  Return in 1 year (on 03/01/2025) for annual labs, and then on 03/03/2025 for annual visit, or as needed.   Annual Wellness Visit done today including the all of the following: Reviewed patient's Family Medical History Reviewed patient's SDOH and reviewed tobacco, alcohol, and drug use.  Reviewed and updated list of patient's medical providers Assessment of cognitive impairment was done  Assessed patient's functional ability Established a written schedule for health screening services Health Risk Assessent Completed and Reviewed  Discussed health benefits of physical activity, and encouraged him to engage in regular exercise appropriate for his age and condition.   I,Emily Lagle,acting as a Neurosurgeon for Ronal JINNY Hailstone, MD.,have documented all relevant documentation on the behalf of Ronal JINNY Hailstone, MD,as directed by  Ronal JINNY Hailstone, MD while in the presence of Ronal JINNY Hailstone, MD.   I, Ronal JINNY Hailstone, MD, have reviewed all documentation for and agree with the above Annual Wellness Visit documentation.  Ronal JINNY Hailstone, MD Internal Medicine 02/26/2024

## 2024-02-25 ENCOUNTER — Ambulatory Visit: Payer: Self-pay | Admitting: Internal Medicine

## 2024-02-25 LAB — CBC WITH DIFFERENTIAL/PLATELET
Absolute Lymphocytes: 1765 {cells}/uL (ref 850–3900)
Absolute Monocytes: 500 {cells}/uL (ref 200–950)
Basophils Absolute: 60 {cells}/uL (ref 0–200)
Basophils Relative: 1.2 %
Eosinophils Absolute: 130 {cells}/uL (ref 15–500)
Eosinophils Relative: 2.6 %
HCT: 41.7 % (ref 38.5–50.0)
Hemoglobin: 13.8 g/dL (ref 13.2–17.1)
MCH: 31.6 pg (ref 27.0–33.0)
MCHC: 33.1 g/dL (ref 32.0–36.0)
MCV: 95.4 fL (ref 80.0–100.0)
MPV: 11 fL (ref 7.5–12.5)
Monocytes Relative: 10 %
Neutro Abs: 2545 {cells}/uL (ref 1500–7800)
Neutrophils Relative %: 50.9 %
Platelets: 213 Thousand/uL (ref 140–400)
RBC: 4.37 Million/uL (ref 4.20–5.80)
RDW: 12.8 % (ref 11.0–15.0)
Total Lymphocyte: 35.3 %
WBC: 5 Thousand/uL (ref 3.8–10.8)

## 2024-02-25 LAB — HEMOGLOBIN A1C
Hgb A1c MFr Bld: 5.3 % (ref <5.7)
Mean Plasma Glucose: 105 mg/dL
eAG (mmol/L): 5.8 mmol/L

## 2024-02-25 LAB — COMPLETE METABOLIC PANEL WITHOUT GFR
AG Ratio: 2.6 (calc) — ABNORMAL HIGH (ref 1.0–2.5)
ALT: 20 U/L (ref 9–46)
AST: 25 U/L (ref 10–35)
Albumin: 4.6 g/dL (ref 3.6–5.1)
Alkaline phosphatase (APISO): 21 U/L — ABNORMAL LOW (ref 35–144)
BUN: 9 mg/dL (ref 7–25)
CO2: 31 mmol/L (ref 20–32)
Calcium: 9.6 mg/dL (ref 8.6–10.3)
Chloride: 97 mmol/L — ABNORMAL LOW (ref 98–110)
Creat: 0.89 mg/dL (ref 0.70–1.28)
Globulin: 1.8 g/dL — ABNORMAL LOW (ref 1.9–3.7)
Glucose, Bld: 86 mg/dL (ref 65–99)
Potassium: 4.6 mmol/L (ref 3.5–5.3)
Sodium: 134 mmol/L — ABNORMAL LOW (ref 135–146)
Total Bilirubin: 0.8 mg/dL (ref 0.2–1.2)
Total Protein: 6.4 g/dL (ref 6.1–8.1)

## 2024-02-25 LAB — PSA: PSA: 2.83 ng/mL (ref <=4.00)

## 2024-02-25 LAB — LIPID PANEL
Cholesterol: 141 mg/dL (ref <200)
HDL: 51 mg/dL (ref >=40)
LDL Cholesterol (Calc): 77 mg/dL
Non-HDL Cholesterol (Calc): 90 mg/dL (ref <130)
Total CHOL/HDL Ratio: 2.8 (calc) (ref <5.0)
Triglycerides: 51 mg/dL (ref <150)

## 2024-02-25 LAB — MICROALBUMIN / CREATININE URINE RATIO
Creatinine, Urine: 60 mg/dL (ref 20–320)
Microalb, Ur: <0.2 mg/dL

## 2024-02-26 ENCOUNTER — Encounter: Payer: Self-pay | Admitting: Internal Medicine

## 2024-02-26 ENCOUNTER — Ambulatory Visit: Admitting: Internal Medicine

## 2024-02-26 VITALS — BP 120/80 | HR 78 | Ht 71.75 in | Wt 234.0 lb

## 2024-02-26 DIAGNOSIS — L309 Dermatitis, unspecified: Secondary | ICD-10-CM

## 2024-02-26 DIAGNOSIS — K2100 Gastro-esophageal reflux disease with esophagitis, without bleeding: Secondary | ICD-10-CM

## 2024-02-26 DIAGNOSIS — G8929 Other chronic pain: Secondary | ICD-10-CM

## 2024-02-26 DIAGNOSIS — R609 Edema, unspecified: Secondary | ICD-10-CM

## 2024-02-26 DIAGNOSIS — M5441 Lumbago with sciatica, right side: Secondary | ICD-10-CM

## 2024-02-26 DIAGNOSIS — E785 Hyperlipidemia, unspecified: Secondary | ICD-10-CM

## 2024-02-26 DIAGNOSIS — I10 Essential (primary) hypertension: Secondary | ICD-10-CM | POA: Diagnosis not present

## 2024-02-26 DIAGNOSIS — Z0000 Encounter for general adult medical examination without abnormal findings: Secondary | ICD-10-CM | POA: Diagnosis not present

## 2024-02-26 DIAGNOSIS — N401 Enlarged prostate with lower urinary tract symptoms: Secondary | ICD-10-CM | POA: Diagnosis not present

## 2024-02-26 DIAGNOSIS — M109 Gout, unspecified: Secondary | ICD-10-CM | POA: Diagnosis not present

## 2024-02-26 DIAGNOSIS — Z7984 Long term (current) use of oral hypoglycemic drugs: Secondary | ICD-10-CM | POA: Diagnosis not present

## 2024-02-26 DIAGNOSIS — E1169 Type 2 diabetes mellitus with other specified complication: Secondary | ICD-10-CM

## 2024-02-26 DIAGNOSIS — F418 Other specified anxiety disorders: Secondary | ICD-10-CM

## 2024-02-26 DIAGNOSIS — E119 Type 2 diabetes mellitus without complications: Secondary | ICD-10-CM

## 2024-02-26 DIAGNOSIS — Z860101 Personal history of adenomatous and serrated colon polyps: Secondary | ICD-10-CM

## 2024-02-26 NOTE — Progress Notes (Signed)
 Subjective:   [name] [name] is a 73 y.o. male who presents for Medicare Annual/Subsequent preventive examination.  Visit Complete: In person  Patient Medicare AWV questionnaire was completed by the patient on 02/26/2024; I have confirmed that all information answered by patient is correct and no changes since this date.  Cardiac Risk Factors include: advanced age (>55men, >36 women);hypertension;dyslipidemia;obesity (BMI >30kg/m2);diabetes mellitus     Objective:    Today's Vitals   02/26/24 0948 02/26/24 0954  BP: 120/80   Pulse: 78   SpO2: 97%   Weight: 234 lb (106.1 kg)   Height: 5' 11.75 (1.822 m)   PainSc: 0-No pain 0-No pain   Body mass index is 31.96 kg/m.     02/26/2024    9:51 AM 02/25/2023    9:56 AM 02/07/2022    2:51 PM  Advanced Directives  Does Patient Have a Medical Advance Directive? Yes No Yes  Type of Advance Directive Living will;Healthcare Power of [employer] Power of [place];Living will  Does patient want to make changes to medical advance directive?   No - Patient declined  Copy of Healthcare Power of Attorney in Chart? No - copy requested  No - copy requested    Current Medications (verified) Outpatient Encounter Medications as of 02/26/2024  Medication Sig   allopurinol (ZYLOPRIM) 300 MG tablet TAKE ONE TABLET BY MOUTH ONE TIME DAILY   amLODipine (NORVASC) 5 MG tablet TAKE ONE TABLET BY MOUTH ONE TIME DAILY   Blood Glucose Monitoring Suppl (ONETOUCH VERIO FLEX SYSTEM) w/Device KIT Use to test blood sugar   buPROPion (WELLBUTRIN XL) 300 MG 24 hr tablet TAKE ONE TABLET BY MOUTH ONE TIME DAILY   clonazePAM (KLONOPIN) 1 MG tablet TAKE ONE TABLET BY MOUTH TWICE A DAY AS NEEDED FOR ANXIETY   cyclobenzaprine (FLEXERIL) 10 MG tablet Take 1 tablet (10 mg total) by mouth at bedtime as needed for muscle spasms.   fenofibrate 160 MG tablet TAKE ONE TABLET BY MOUTH ONE TIME DAILY   hydrochlorothiazide (HYDRODIURIL) 25 MG tablet TAKE ONE TABLET BY  MOUTH ONE TIME DAILY   hydrochlorothiazide (HYDRODIURIL) 25 MG tablet Take 1 tablet (25 mg total) by mouth daily.   Insulin Pen Needle (B-D UF III MINI PEN NEEDLES) 31G X 5 MM MISC Check glucose twice a day.   Insulin Pen Needle (B-D UF III MINI PEN NEEDLES) 31G X 5 MM MISC Check glucose 4 times daily   JANUVIA 100 MG tablet TAKE ONE TABLET BY MOUTH ONE TIME DAILY   Lancets (ONETOUCH DELICA PLUS LANCET33G) MISC USE TO TEST ONCE DAILY   LANTUS SOLOSTAR 100 UNIT/ML Solostar Pen INJECT 22 UNITS INTO THE SKIN DAILY   metFORMIN (GLUCOPHAGE) 1000 MG tablet TAKE ONE TABLET BY MOUTH TWICE A DAY WITH A MEAL   Multiple Vitamin (MULTIVITAMIN) tablet Take 1 tablet by mouth daily.     olmesartan (BENICAR) 40 MG tablet TAKE ONE TABLET BY MOUTH ONE TIME DAILY   ONETOUCH VERIO test strip USE TO TEST BLOOD SUGAR ONCE DAILY   rosuvastatin (CRESTOR) 5 MG tablet TAKE ONE TABLET BY MOUTH ONE TIME DAILY   tamsulosin (FLOMAX) 0.4 MG CAPS capsule TAKE ONE CAPSULE BY MOUTH ONE TIME DAILY   triamcinolone 0.1%-Cerave equivalent 1:1 cream mixture Apply topically 2 (two) times daily.   No facility-administered encounter medications on file as of 02/26/2024.    Allergies (verified) Patient has no known allergies.   History: Past Medical History:  Diagnosis Date   BPH (benign prostatic  hypertrophy)    Depression    Diabetes mellitus without complication (HCC)    ED (erectile dysfunction)    Hyperlipidemia    Hypertension    Past Surgical History:  Procedure Laterality Date   BACK SURGERY     COLONOSCOPY  2016   Dr.Armbruster   NECK SURGERY     Family History  Problem Relation Age of Onset   Cancer Mother    Stomach cancer Mother    Cancer Father    Colon cancer Neg Hx    Esophageal cancer Neg Hx    Social History   Socioeconomic History   Marital status: Married    Spouse name: Not on file   Number of children: Not on file   Years of education: Not on file   Highest education level: Associate  degree: occupational, Scientist, product/process development, or vocational program  Occupational History   Not on file  Tobacco Use   Smoking status: Former    Current packs/day: 0.00    Types: Cigarettes    Quit date: 12/27/1989    Years since quitting: 34.1   Smokeless tobacco: Never  Vaping Use   Vaping status: Never Used  Substance and Sexual Activity   Alcohol use: Yes    Alcohol/week: 6.0 standard drinks of alcohol    Types: 6 Cans of beer per week    Comment: beer 6 pack per week   Drug use: No   Sexual activity: Not on file  Other Topics Concern   Not on file  Social History Narrative   Remarried - 1st wife died due to cancer. Completed 1 year of college. Non-smoker, drinks beer. Enjoys golf. 2 adult children, 1 son and 1 daughter. Self-employed Animator.   Social Drivers of Corporate investment banker Strain: Low Risk  (02/26/2024)   Overall Financial Resource Strain (CARDIA)    Difficulty of Paying Living Expenses: Not hard at all  Food Insecurity: No Food Insecurity (02/26/2024)   Hunger Vital Sign    Worried About Running Out of Food in the Last Year: Never true    Ran Out of Food in the Last Year: Never true  Transportation Needs: No Transportation Needs (02/26/2024)   PRAPARE - Administrator, Civil Service (Medical): No    Lack of Transportation (Non-Medical): No  Physical Activity: Sufficiently Active (02/26/2024)   Exercise Vital Sign    Days of Exercise per Week: 5 days    Minutes of Exercise per Session: 50 min  Stress: No Stress Concern Present (02/26/2024)   [employer] of Occupational Health - Occupational Stress Questionnaire    Feeling of Stress: Not at all  Recent Concern: Stress - Stress Concern Present (02/21/2024)   [employer] of Occupational Health - Occupational Stress Questionnaire    Feeling of Stress: To some extent  Social Connections: Unknown (02/26/2024)   Social Connection and Isolation Panel    Frequency of Communication with  Friends and Family: Three times a week    Frequency of Social Gatherings with Friends and Family: Twice a week    Attends Religious Services: Patient declined    Database administrator or Organizations: Patient declined    Attends Banker Meetings: Patient declined    Marital Status: Married  Recent Concern: Social Connections - Moderately Isolated (02/21/2024)   Social Connection and Isolation Panel    Frequency of Communication with Friends and Family: More than three times a week    Frequency of Social Gatherings with  Friends and Family: Once a week    Attends Religious Services: Patient declined    Database administrator or Organizations: No    Attends Engineer, structural: Not on file    Marital Status: Married    Tobacco Counseling Counseling given: Not Answered   Clinical Intake:  Pre-visit preparation completed: Yes  Pain : No/denies pain Pain Score: 0-No pain     BMI - recorded: 31.96 Nutritional Status: BMI > 30  Obese Diabetes: Yes CBG done?: Yes  How often do you need to have someone help you when you read instructions, pamphlets, or other written materials from your doctor or pharmacy?: 1 - Never  Interpreter Needed?: No  Information entered by :: Kathlynn Porto, CMA   Activities of Daily Living    02/26/2024    9:45 AM 02/25/2024   12:27 PM  In your present state of health, do you have any difficulty performing the following activities:  Hearing? 0 0  Vision? 0 0  Difficulty concentrating or making decisions? 0 0  Walking or climbing stairs? 0 0  Dressing or bathing? 0 0  Doing errands, shopping? 0 0  Preparing Food and eating ? N N  Using the Toilet? N N  In the past six months, have you accidently leaked urine? N N  Do you have problems with loss of bowel control? N N  Managing your Medications? N N  Managing your Finances? N N  Housekeeping or managing your Housekeeping? N N    Patient Care Team: Perri Ronal PARAS, MD as  PCP - General (Internal Medicine) Abigail, Maude [facility])  Indicate any recent Medical Services you may have received from other than [facility] providers in the past year (date may be approximate).     Assessment:   This is a routine wellness examination for [name] [name].   Goals Addressed   None    Depression Screen    02/26/2024   10:03 AM 02/25/2023   10:07 AM 08/15/2022    9:21 AM 02/07/2022    2:52 PM 02/02/2021   11:13 AM 01/25/2020   10:02 AM 09/07/2019    8:49 PM  PHQ 2/9 Scores  PHQ - 2 Score 0 0 0 0 0 0 0    Fall Risk    02/26/2024   10:05 AM 02/25/2024   12:27 PM 02/21/2024    2:58 PM 02/25/2023    9:58 AM 08/15/2022    9:21 AM  Fall Risk   Falls in the past year? 0 0 0 0 0  Number falls in past yr: 0 0 0 0 0  Injury with Fall? 0 0 0 0 0  Risk for fall due to : No Fall Risks   No Fall Risks No Fall Risks  Follow up Falls prevention discussed;Education provided;Falls evaluation completed    Falls prevention discussed    MEDICARE RISK AT HOME: Medicare Risk at Home Any stairs in or around the home?: Yes If so, are there any without handrails?: No Home free of loose throw rugs in walkways, pet beds, electrical cords, etc?: Yes Adequate lighting in your home to reduce risk of falls?: Yes Life alert?: No Use of a cane, walker or w/c?: No Grab bars in the bathroom?: No Shower chair or bench in shower?: Yes Elevated toilet seat or a handicapped toilet?: No  TIMED UP AND GO:  Was the test performed?  No    Cognitive Function:        02/26/2024  9:51 AM 02/25/2023    9:57 AM 02/07/2022    2:52 PM  6CIT Screen  What Year? 0 points 0 points   What month? 0 points 0 points   What time? 0 points 0 points 0 points  Count back from 20 0 points 0 points 0 points  Months in reverse 0 points 0 points 0 points  Repeat phrase 0 points 0 points 0 points  Total Score 0 points 0 points     Immunizations Immunization History  Administered Date(s) Administered   Influenza Split  07/04/2011   Influenza Whole 06/29/2010   Influenza, High Dose Seasonal PF 05/19/2017   Influenza,inj,Quad PF,6+ Mos 04/05/2013, 04/04/2016, 04/06/2018, 08/03/2019, 08/10/2021, 07/30/2022   Influenza-Unspecified 04/21/2014, 05/19/2017   PFIZER(Purple Top)SARS-COV-2 Vaccination 08/28/2019, 09/20/2019, 04/03/2020   PNEUMOCOCCAL CONJUGATE-20 02/25/2023   Pneumococcal Conjugate-13 10/31/2016   Pneumococcal Polysaccharide-23 06/26/2009   Tdap 08/02/2002, 04/05/2013    TDAP status: Due, Education has been provided regarding the importance of this vaccine. Advised may receive this vaccine at local pharmacy or Health Dept. Aware to provide a copy of the vaccination record if obtained from local pharmacy or Health Dept. Verbalized acceptance and understanding.  Flu Vaccine status: Due, Education has been provided regarding the importance of this vaccine. Advised may receive this vaccine at local pharmacy or Health Dept. Aware to provide a copy of the vaccination record if obtained from local pharmacy or Health Dept. Verbalized acceptance and understanding.  Pneumococcal vaccine status: Up to date  Covid-19 vaccine status: Information provided on how to obtain vaccines.   Qualifies for Shingles Vaccine? Yes   Zostavax completed No   Shingrix Completed?: No.    Education has been provided regarding the importance of this vaccine. Patient has been advised to call insurance company to determine out of pocket expense if they have not yet received this vaccine. Advised may also receive vaccine at local pharmacy or Health Dept. Verbalized acceptance and understanding.  Screening Tests Health Maintenance  Topic Date Due   Zoster Vaccines- Shingrix (1 of 2) Never done   DTaP/Tdap/Td (3 - Td or Tdap) 04/06/2023   INFLUENZA VACCINE  02/13/2024   OPHTHALMOLOGY EXAM  03/03/2024   HEMOGLOBIN A1C  08/26/2024   Diabetic kidney evaluation - eGFR measurement  02/23/2025   Diabetic kidney evaluation - Urine ACR   02/23/2025   Medicare Annual Wellness (AWV)  02/23/2025   FOOT EXAM  02/25/2025   Colonoscopy  07/27/2028   Pneumococcal Vaccine: 50+ Years  Completed   HPV VACCINES  Aged Out   Meningococcal B Vaccine  Aged Out   COVID-19 Vaccine  Discontinued   Hepatitis C Screening  Discontinued    Health Maintenance  Health Maintenance Due  Topic Date Due   Zoster Vaccines- Shingrix (1 of 2) Never done   DTaP/Tdap/Td (3 - Td or Tdap) 04/06/2023   INFLUENZA VACCINE  02/13/2024    Colorectal cancer screening: Type of screening: Cologuard. Completed 07/27/2021. Repeat every 7 years  Lung Cancer Screening: (Low Dose CT Chest recommended if Age 22-80 years, 20 pack-year currently smoking OR have quit w/in 15years.) does not qualify.   Lung Cancer Screening Referral:   Additional Screening:  Hepatitis C Screening: does not qualify; Completed   Vision Screening: Recommended annual ophthalmology exams for early detection of glaucoma and other disorders of the eye. Is the patient up to date with their annual eye exam?  Yes  Who is the provider or what is the name of the office in which the  patient attends annual eye exams? [facility] If pt is not established with a provider, would they like to be referred to a provider to establish care? No .   Dental Screening: Recommended annual dental exams for proper oral hygiene  Diabetic Foot Exam: Diabetic Foot Exam: Completed 02/26/2024  Community Resource Referral / Chronic Care Management: CRR required this visit?  No   CCM required this visit?  No     Plan:     I have personally reviewed and noted the following in the patient's chart:   Medical and social history Use of alcohol, tobacco or illicit drugs  Current medications and supplements including opioid prescriptions. Patient is not currently taking opioid prescriptions. Functional ability and status Nutritional status Physical activity Advanced directives List of other  physicians Hospitalizations, surgeries, and ER visits in previous 12 months Vitals Screenings to include cognitive, depression, and falls Referrals and appointments  In addition, I have reviewed and discussed with patient certain preventive protocols, quality metrics, and best practice recommendations. A written personalized care plan for preventive services as well as general preventive health recommendations were provided to patient.     [name] [name], CMA   02/26/2024   After Visit Summary: (In Person-Printed) AVS printed and given to the patient   I have reviewed and agree with the above Annual Wellness Visit documentation.  Ronal Norleen Hailstone, MD. Internal Medicine 02/26/2024

## 2024-02-26 NOTE — Patient Instructions (Addendum)
 It was a pleasure to see you today. Labs are stable. Weight is good. Diabetic control is very good. Continue same meds and return in one year or as needed. Vaccines discussed and may be obtained at pharmacy.

## 2024-03-04 DIAGNOSIS — E119 Type 2 diabetes mellitus without complications: Secondary | ICD-10-CM | POA: Diagnosis not present

## 2024-03-04 LAB — HM DIABETES EYE EXAM

## 2024-03-05 ENCOUNTER — Encounter: Payer: Self-pay | Admitting: Internal Medicine

## 2024-03-07 ENCOUNTER — Encounter: Payer: Self-pay | Admitting: Internal Medicine

## 2024-03-10 ENCOUNTER — Other Ambulatory Visit: Payer: Self-pay | Admitting: Internal Medicine

## 2024-03-25 DIAGNOSIS — Z85828 Personal history of other malignant neoplasm of skin: Secondary | ICD-10-CM | POA: Diagnosis not present

## 2024-03-25 DIAGNOSIS — D485 Neoplasm of uncertain behavior of skin: Secondary | ICD-10-CM | POA: Diagnosis not present

## 2024-03-25 DIAGNOSIS — L905 Scar conditions and fibrosis of skin: Secondary | ICD-10-CM | POA: Diagnosis not present

## 2024-03-25 DIAGNOSIS — L738 Other specified follicular disorders: Secondary | ICD-10-CM | POA: Diagnosis not present

## 2024-03-25 DIAGNOSIS — L570 Actinic keratosis: Secondary | ICD-10-CM | POA: Diagnosis not present

## 2024-03-25 DIAGNOSIS — D225 Melanocytic nevi of trunk: Secondary | ICD-10-CM | POA: Diagnosis not present

## 2024-03-25 DIAGNOSIS — L0889 Other specified local infections of the skin and subcutaneous tissue: Secondary | ICD-10-CM | POA: Diagnosis not present

## 2024-03-27 ENCOUNTER — Other Ambulatory Visit: Payer: Self-pay | Admitting: Internal Medicine

## 2024-05-04 ENCOUNTER — Other Ambulatory Visit: Payer: Self-pay | Admitting: Internal Medicine

## 2024-05-16 ENCOUNTER — Telehealth: Payer: Self-pay

## 2024-05-16 NOTE — Progress Notes (Signed)
 Contacted patient to discuss potential medication access barriers related to use of non-preferred pharmacy.   Left a HIPAA-compliant voicemail with direct call back number.   Woodie Jock, PharmD PGY1 Pharmacy Resident  05/16/2024

## 2024-05-17 NOTE — Progress Notes (Signed)
 Patient called back yesterday, I returned the call today.   Discussed preferred and non-preferred pharmacies with [employer]. He is interested in moving pharmacies for cost savings, and plans on calling back if there are any concerns or questions. Will follow up with a MyChart message with the list of preferred pharmacies and a direct call back number.   Woodie Jock, PharmD PGY1 Pharmacy Resident  05/17/2024

## 2024-08-03 ENCOUNTER — Other Ambulatory Visit: Payer: Self-pay | Admitting: Internal Medicine

## 2024-08-03 DIAGNOSIS — I10 Essential (primary) hypertension: Secondary | ICD-10-CM

## 2024-08-03 DIAGNOSIS — E8881 Metabolic syndrome: Secondary | ICD-10-CM

## 2024-08-03 DIAGNOSIS — K2100 Gastro-esophageal reflux disease with esophagitis, without bleeding: Secondary | ICD-10-CM

## 2024-08-07 ENCOUNTER — Other Ambulatory Visit: Payer: Self-pay | Admitting: Internal Medicine

## 2025-03-01 ENCOUNTER — Other Ambulatory Visit: Payer: Self-pay

## 2025-03-03 ENCOUNTER — Ambulatory Visit: Payer: Self-pay | Admitting: Internal Medicine
